# Patient Record
Sex: Female | Born: 1970
Health system: Southern US, Community
[De-identification: ages and names within clinical notes are randomized; demographics above are authoritative.]

## PROBLEM LIST (undated history)

## (undated) DIAGNOSIS — F329 Major depressive disorder, single episode, unspecified: Secondary | ICD-10-CM

## (undated) DIAGNOSIS — M549 Dorsalgia, unspecified: Secondary | ICD-10-CM

## (undated) DIAGNOSIS — F3289 Other specified depressive episodes: Secondary | ICD-10-CM

## (undated) DIAGNOSIS — M4120 Other idiopathic scoliosis, site unspecified: Secondary | ICD-10-CM

## (undated) DIAGNOSIS — J301 Allergic rhinitis due to pollen: Secondary | ICD-10-CM

## (undated) HISTORY — PX: UTERINE FIBROID EMBOLIZATION: SHX825

## (undated) HISTORY — DX: Other idiopathic scoliosis, site unspecified: M41.20

## (undated) HISTORY — DX: Major depressive disorder, single episode, unspecified: F32.9

## (undated) HISTORY — DX: Allergic rhinitis due to pollen: J30.1

## (undated) HISTORY — DX: Dorsalgia, unspecified: M54.9

## (undated) HISTORY — DX: Other specified depressive episodes: F32.89

---

## 2006-03-20 LAB — CONVERTED CEMR LAB: Pap Smear: NORMAL

## 2006-07-09 ENCOUNTER — Encounter: Payer: Self-pay | Admitting: Maternal & Fetal Medicine

## 2006-08-27 ENCOUNTER — Encounter: Payer: Self-pay | Admitting: Maternal & Fetal Medicine

## 2006-10-15 ENCOUNTER — Encounter: Payer: Self-pay | Admitting: Obstetrics and Gynecology

## 2006-11-12 ENCOUNTER — Observation Stay: Payer: Self-pay | Admitting: Obstetrics and Gynecology

## 2006-11-16 ENCOUNTER — Observation Stay: Payer: Self-pay | Admitting: Obstetrics and Gynecology

## 2006-11-23 ENCOUNTER — Observation Stay: Payer: Self-pay | Admitting: Obstetrics and Gynecology

## 2006-11-30 ENCOUNTER — Observation Stay: Payer: Self-pay | Admitting: Obstetrics and Gynecology

## 2006-12-07 ENCOUNTER — Observation Stay: Payer: Self-pay | Admitting: Obstetrics and Gynecology

## 2006-12-10 ENCOUNTER — Inpatient Hospital Stay: Payer: Self-pay | Admitting: Obstetrics and Gynecology

## 2008-02-18 ENCOUNTER — Encounter: Payer: Self-pay | Admitting: Family Medicine

## 2008-02-18 LAB — CONVERTED CEMR LAB: Pap Smear: NORMAL

## 2008-05-18 ENCOUNTER — Encounter: Payer: Self-pay | Admitting: Family Medicine

## 2008-05-18 LAB — CONVERTED CEMR LAB: Pap Smear: NORMAL

## 2008-05-18 LAB — HM PAP SMEAR

## 2008-10-26 ENCOUNTER — Ambulatory Visit: Payer: Self-pay | Admitting: Family Medicine

## 2008-10-26 DIAGNOSIS — F331 Major depressive disorder, recurrent, moderate: Secondary | ICD-10-CM | POA: Insufficient documentation

## 2008-10-26 DIAGNOSIS — G8929 Other chronic pain: Secondary | ICD-10-CM | POA: Insufficient documentation

## 2008-10-26 DIAGNOSIS — M4120 Other idiopathic scoliosis, site unspecified: Secondary | ICD-10-CM | POA: Insufficient documentation

## 2008-10-26 DIAGNOSIS — J301 Allergic rhinitis due to pollen: Secondary | ICD-10-CM | POA: Insufficient documentation

## 2008-10-26 DIAGNOSIS — M549 Dorsalgia, unspecified: Secondary | ICD-10-CM

## 2008-10-26 DIAGNOSIS — L259 Unspecified contact dermatitis, unspecified cause: Secondary | ICD-10-CM | POA: Insufficient documentation

## 2009-04-06 ENCOUNTER — Telehealth: Payer: Self-pay | Admitting: Family Medicine

## 2010-03-19 NOTE — Progress Notes (Signed)
Summary: Rx Effexor  Phone Note Refill Request Call back at Home Phone [phone] Message from:  Patient on April 06, 2009 11:29 AM  Refills Requested: Medication #1:  EFFEXOR XR 37.5 MG XR24H-CAP 1 tab by mouth daily Received Rx refill request from patient.  Please call when Rx has been called in.   Method Requested: Telephone to Pharmacy Initial call taken by: Linde Gillis CMA [facility]),  April 06, 2009 11:30 AM  Follow-up for Phone Call        Please call to find out what dose she is on..I have both down... Follow-up by: Kerby Nora MD,  April 06, 2009 11:33 AM    Prescriptions: EFFEXOR XR 75 MG XR24H-CAP (VENLAFAXINE HCL) 1 tab by mouth daily  #30 x 6   Entered by:   Benny Lennert CMA (AAMA)   Authorized by:   Kerby Nora MD   Signed by:   Benny Lennert CMA (AAMA) on 04/06/2009   Method used:   Electronically to        [facility]* (retail)       [address]       [place], [place]  [address]       Ph: [phone]       Fax: [phone]   RxID:   [id number]

## 2010-03-19 NOTE — Letter (Signed)
Summary: Records Dated 01-29-05 thru 03-14-08/[facility]  Records Dated 01-29-05 thru 03-14-08/[facility]   Imported By: Lanelle Bal 03/05/2009 09:40:20  _____________________________________________________________________  External Attachment:    Type:   Image     Comment:   External Document

## 2010-03-25 ENCOUNTER — Telehealth: Payer: Self-pay | Admitting: Family Medicine

## 2010-04-02 ENCOUNTER — Ambulatory Visit (INDEPENDENT_AMBULATORY_CARE_PROVIDER_SITE_OTHER): Payer: BC Managed Care – PPO | Admitting: Family Medicine

## 2010-04-02 ENCOUNTER — Encounter: Payer: Self-pay | Admitting: Family Medicine

## 2010-04-02 DIAGNOSIS — J45990 Exercise induced bronchospasm: Secondary | ICD-10-CM | POA: Insufficient documentation

## 2010-04-02 DIAGNOSIS — F3289 Other specified depressive episodes: Secondary | ICD-10-CM

## 2010-04-02 DIAGNOSIS — F329 Major depressive disorder, single episode, unspecified: Secondary | ICD-10-CM

## 2010-04-04 NOTE — Progress Notes (Signed)
Summary: Venlafaxine  Phone Note Refill Request Message from:  Fax from Pharmacy on March 25, 2010 12:23 PM  Refills Requested: Medication #1:  EFFEXOR XR 75 MG XR24H-CAP 1 tab by mouth daily. Patient has not been seen in > 1 year.  [facility].   Method Requested: Electronic Initial call taken by: Delilah Shan CMA [facility]),  March 25, 2010 12:24 PM  Follow-up for Phone Call        refill once.. need to make appt for CPX.  Follow-up by: Kerby Nora MD,  March 26, 2010 8:12 AM  Additional Follow-up for Phone Call Additional follow up Details #1::        refilled medication and left message for patient to call and schedule physical appt before she runs out of medication Additional Follow-up by: Benny Lennert CMA (AAMA),  March 26, 2010 8:15 AM    New/Updated Medications: EFFEXOR XR 75 MG XR24H-CAP (VENLAFAXINE HCL) 1 tab by mouth daily Prescriptions: EFFEXOR XR 75 MG XR24H-CAP (VENLAFAXINE HCL) 1 tab by mouth daily  #30 x 0   Entered by:   Benny Lennert CMA (AAMA)   Authorized by:   Kerby Nora MD   Signed by:   Benny Lennert CMA (AAMA) on 03/26/2010   Method used:   Electronically to        [facility] (retail)       [address]       [place], [place]  [address]       Ph: [phone]       Fax: [phone]   RxID:   [id number]

## 2010-04-08 ENCOUNTER — Encounter (INDEPENDENT_AMBULATORY_CARE_PROVIDER_SITE_OTHER): Payer: Self-pay | Admitting: *Deleted

## 2010-04-08 ENCOUNTER — Ambulatory Visit (INDEPENDENT_AMBULATORY_CARE_PROVIDER_SITE_OTHER): Payer: BC Managed Care – PPO | Admitting: Family Medicine

## 2010-04-08 ENCOUNTER — Encounter: Payer: Self-pay | Admitting: Family Medicine

## 2010-04-08 DIAGNOSIS — T6191XA Toxic effect of unspecified seafood, accidental (unintentional), initial encounter: Secondary | ICD-10-CM

## 2010-04-08 DIAGNOSIS — T61781A Other shellfish poisoning, accidental (unintentional), initial encounter: Secondary | ICD-10-CM | POA: Insufficient documentation

## 2010-04-10 NOTE — Assessment & Plan Note (Signed)
Summary: REFILL MEDICATION/CLE   [employer]   Vital Signs:  Patient profile:   40 year old female Height:      63 inches Weight:      171.0 pounds BMI:     30.40 Temp:     98.6 degrees F oral Pulse rate:   80 / minute Pulse rhythm:   regular BP sitting:   120 / 80  (left arm) Cuff size:   regular  Vitals Entered By: Benny Lennert CMA [facility]) (April 02, 2010 3:49 PM)  History of Present Illness: Chief complaint refill medication  Last seen in 10/2008..  sees GYN.. CPX 2011  Depression,  well control on effxor XR. NO SI. Not interested in trying to come off the med a tthis time.   SOB associated with allergies.. had exercsie induced asthma as a child, had inhaled. Also triggered with fish, pollen, exercsie etc.  Occuring every few months.  No nighttime cough.   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 1 Tab By Mouth Daily  Allergies (verified): No Known Drug Allergies  Past History:  Past medical, surgical, family and social histories (including risk factors) reviewed, and no changes noted (except as noted below).  Past Medical History: Reviewed history from 10/26/2008 and no changes required. Current Problems:  BACK PAIN  (ICD-724.5) SCOLIOSIS (ICD-737.30) ALLERGIC RHINITIS DUE TO POLLEN (ICD-477.0) DEPRESSION (ICD-311)    Past Surgical History: Reviewed history from 10/26/2008 and no changes required. none  Family History: Reviewed history from 10/26/2008 and no changes required. Family History of Alcoholism/Addiction Family History of Arthritis Family History Depression Family History Diabetes 1st degree relative, brother Family History High cholesterol, brother, father Family History Hypertension, brother, father Family History Liver cancer, father Family History of Neurological disorder(alzhiemers mother) mother with osteoporosis  Social History: Reviewed history from 10/26/2008 and no changes required. 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 Diet: fruit and veggies, loves ice cream, drinks water 32 oz a day, limited calcium intake.   Review of Systems General:  Denies fatigue and fever. CV:  Denies chest pain or discomfort. Resp:  Complains of shortness of breath; SOB associated with allergies.. had exercsie induced asthma as a child, had inhaled. Also triggered with fish, pollen, exercsie etc.  Occuring every few months.  No nighttime cough. Marland Kitchen GI:  Denies abdominal pain, bloody stools, constipation, and diarrhea. GU:  Denies dysuria.  Physical Exam  General:  Well-developed,well-nourished,in no acute distress; alert,appropriate and cooperative throughout examination Ears:  External ear exam shows no significant lesions or deformities.  Otoscopic examination reveals clear canals, tympanic membranes are intact bilaterally without  bulging, retraction, inflammation or discharge. Hearing is grossly normal bilaterally. Nose:  External nasal examination shows no deformity or inflammation. Nasal mucosa are pink and moist without lesions or  exudates. Mouth:  Oral mucosa and oropharynx without lesions or exudates.  Teeth in good repair. Neck:  no carotid bruit or thyromegaly no cervical or supraclavicular lymphadenopathy  Lungs:  Normal respiratory effort, chest expands symmetrically. Lungs are clear to auscultation, no crackles or wheezes. Heart:  Normal rate and regular rhythm. S1 and S2 normal without gallop, murmur, click, rub or other extra sounds. Abdomen:  Bowel sounds positive,abdomen soft and non-tender without masses, organomegaly or hernias noted. Pulses:  R and L posterior tibial pulses are full and equal bilaterally  Extremities:  no edema  Psych:  Cognition and judgment appear intact. Alert and cooperative with normal attention span and concentration. No apparent delusions, illusions, hallucinations   Impression & Recommendations:  Problem # 1:  DEPRESSION (ICD-311) Well controlled. Continue current medication.  The following medications were removed from the medication list:    Effexor Xr 37.5 Mg Xr24h-cap (Venlafaxine hcl) .Marland Kitchen... 1 tab by mouth daily Her updated medication list for this problem includes:    Effexor Xr 75 Mg Xr24h-cap (Venlafaxine hcl) .Marland Kitchen... 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 if occuring more often will need PFTs and likely daily singulair. Her updated medication list for this problem includes:    Proair Hfa 108 (90 Base) Mcg/act Aers (Albuterol sulfate) .Marland Kitchen... 2 puff inhaled every 4-6 hours as needed for wheeze.  Problem # 3:  Preventive Health Care (ICD-V70.0) Assessment: Comment Only Due for CPX and fasting labs.. schedule  Complete Medication List: 1)  Mirena 20 Mcg/24hr Iud (Levonorgestrel) 2)  Effexor Xr 75 Mg Xr24h-cap (Venlafaxine hcl) .Marland Kitchen.. 1 tab by mouth daily 3)  Proair Hfa 108 (90 Base) Mcg/act Aers (Albuterol sulfate) .... 2 puff inhaled every 4-6 hours as needed for wheeze.  Patient Instructions: 1)  If wheezing  or inhaler use occuring ever week.. call to make an appt to get better control.  2)   Use claritin for allergiy season.  3)   Fasting lipids, CMEt Dx v77.91. prior to CPX.  4)  Schedule CPX  with pap after April 2012. Prescriptions: EFFEXOR XR 75 MG XR24H-CAP (VENLAFAXINE HCL) 1 tab by mouth daily  #90 x 3   Entered and Authorized by:   Kerby Nora MD   Signed by:   Kerby Nora MD on 04/02/2010   Method used:   Electronically to        [facility]* (retail)       [address]       [place], [place]  [address]       Ph: [phone]       Fax: [phone]   RxID:   [id number] PROAIR HFA 108 (90 BASE) MCG/ACT AERS (ALBUTEROL SULFATE) 2 puff inhaled every 4-6 hours as needed for wheeze.  #1 x 2   Entered and Authorized by:   Kerby Nora MD   Signed by:   Kerby Nora MD on 04/02/2010   Method used:   Electronically to        [facility]* (retail)       [address]       [place], [place]  [address]       Ph: [phone]       Fax: [phone]   RxID:   [id number]    Orders  Added: 1)  Est. Patient Level IV [[address]]    Current Allergies (reviewed today): No known allergies   Flu Vaccine Next Due:  Refused Last PAP:  normal (03/20/2006 10:35:25 AM) PAP Result Date:  05/18/2008 PAP Result:  normal PAP Next Due:  1 yr

## 2010-04-16 NOTE — Assessment & Plan Note (Signed)
Summary: ALLERGIC REACTION TO FISH/CLE  [employer]   Vital Signs:  Patient profile:   40 year old female Height:      63 inches Weight:      171.50 pounds BMI:     30.49 Temp:     99.3 degrees F oral Pulse rate:   80 / minute Pulse rhythm:   regular BP sitting:   110 / 70  (left arm)  Vitals Entered By: Benny Lennert CMA [facility]) (April 08, 2010 9:28 AM)  History of Present Illness: 3Chief complaint allergic reaction to fish  39 year old female:  thank you to evaluate the patient when told that we have a patient with fish allergy and facial swelling. Has a known fish allergy, started around 3 o'clock yesterday.  Taken Benadryl, 2 pills once yesterday. no other medications have been taken.  The patient does have a prior history of facial swelling from fish allergy, but this is been many years. She also doesn't a history of bronchospasm due to fish allergy, but is not having any currently.  She did not eat any fish, she did not cook fish, but she did handle a slightly when her husband was going to cook some fish.  right-sided upper face and eyelid swelling right greater than left. Left eye is slightly swollen, but she is able to fully open her left eye.  Allergies (verified): No Known Drug Allergies  Past History:  Past medical, surgical, family and social histories (including risk factors) reviewed, and no changes noted (except as noted below).  Past Medical History: Reviewed history from 10/26/2008 and no changes required. Current Problems:  BACK PAIN (ICD-724.5) SCOLIOSIS (ICD-737.30) ALLERGIC RHINITIS DUE TO POLLEN (ICD-477.0) DEPRESSION (ICD-311)    Past Surgical History: Reviewed history from 10/26/2008 and no changes required. none  Family History: Reviewed history from 10/26/2008 and no changes required. Family History of Alcoholism/Addiction Family History of Arthritis Family History Depression Family History Diabetes 1st degree relative, brother Family  History High cholesterol, brother, father Family History Hypertension, brother, father Family History Liver cancer, father Family History of Neurological disorder(alzhiemers mother) mother with osteoporosis  Social History: Reviewed history from 10/26/2008 and no changes required. 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 Diet: fruit and veggies, loves ice cream, drinks water 32 oz a day, limited calcium intake.   Review of Systems      See HPI General:  Denies chills and fever. Eyes:  able to see, but swelling obstructs R eye.Marland Kitchen  Physical Exam  General:  alert, well-developed, well-nourished, and well-hydrated.   Head:  normocephalic and atraumatic.   Eyes:  vision grossly intact, pupils equal, pupils round, pupils reactive to light, and pupils react to accomodation.    no right-sided eyelid and. Orbital swelling, right greater than left. To some degree on the left. When eyes open, vision and ocular motion is intact. Field of vision is intact. Ears:  no external deformities.   Lungs:  normal respiratory effort.   Skin:  no new rashes facial eczema Psych:  Cognition and judgment appear intact. Alert and cooperative with normal attention span and concentration. 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  Benadryl and cimetidine as below over the next several days. Prednisone  for one week.  Advised about avoidance of fish  Complete Medication List: 1)  Mirena 20 Mcg/24hr Iud (Levonorgestrel) 2)  Effexor Xr 75 Mg Xr24h-cap (Venlafaxine hcl) .Marland Kitchen.. 1 tab by mouth daily 3)  Proair Hfa 108 (90 Base) Mcg/act Aers (Albuterol sulfate) .... 2 puff inhaled every 4-6  hours as needed for wheeze. 4)  Epipen 2-pak 0.3 Mg/0.45ml Devi (Epinephrine) .Marland Kitchen.. 1 [place] injection if severe allergy 5)  Prednisone 20 Mg Tabs (Prednisone) .... 2 tabs by mouth x 4 days, then 1 tab by mouth x 3 days 6)  Cimetidine 400 Mg Tabs (Cimetidine) .Marland Kitchen.. 1 by mouth 4 times daily for 7 days  Patient Instructions: 1)  Benadryl 2 tabs, every 6 hours for the next 2 days 2)  Cimetidine 1 tab every 6 hours for the next 2 days Prescriptions: CIMETIDINE 400 MG TABS (CIMETIDINE) 1 by mouth 4 times daily for 7 days  #28 x 0   Entered and Authorized by:   Hannah Beat MD   Signed by:   Hannah Beat MD on 04/08/2010   Method used:   Electronically to        [facility]* (retail)       [address]       [place], [place]  [address]       Ph: [phone]       Fax: [phone]   RxID:   [id number] PREDNISONE 20 MG TABS (PREDNISONE) 2 tabs by mouth x 4 days, then 1 tab by mouth x 3 days  #11 x 0   Entered and Authorized by:   Hannah Beat MD   Signed by:   Hannah Beat MD on 04/08/2010   Method used:   Electronically to        [facility]* (retail)       [address]       [place], [place]  [address]       Ph: [phone]       Fax: [phone]   RxID:   [id number] EPIPEN 2-PAK 0.3 MG/0.3ML DEVI (EPINEPHRINE) 1 [place] injection if severe allergy  #1 pack x 0   Entered and Authorized by:   Hannah Beat MD   Signed by:   Hannah Beat MD on 04/08/2010   Method used:   Electronically to        [facility]* (retail)       [address]       [place], [place]  [address]       Ph: [phone]       Fax: [phone]   RxID:   [id number]    Orders Added: 1)  Est. Patient Level IV [[address]]    Current Allergies (reviewed today): No known allergies

## 2010-04-16 NOTE — Letter (Signed)
Summary: Out of Work  [employer] [employer] [facility]  [address] [place], [place] [address]   Phone: [phone]  Fax: [phone]    April 08, 2010   Employee:  LEELOO SILVERTHORNE    To Whom It May Concern:   For Medical reasons, please excuse the above named employee from work for the following dates:  Start:  April 08, 2010 9:41 AM   End:   May Return to Work on April 10 2010  If you need additional information, please feel free to contact our office.         Sincerely,   Hannah Beat MD

## 2010-09-19 LAB — BASIC METABOLIC PANEL
BUN: 5 mg/dL (ref 4–21)
Creatinine: 0.8 mg/dL (ref 0.5–1.1)
Glucose: 77 mg/dL

## 2010-09-19 LAB — HEPATIC FUNCTION PANEL
ALT: 26 U/L (ref 7–35)
AST: 21 U/L (ref 13–35)
Alkaline Phosphatase: 60 U/L (ref 25–125)
Bilirubin, Total: 0.2 mg/dL

## 2010-09-26 ENCOUNTER — Encounter: Payer: BC Managed Care – PPO | Admitting: Family Medicine

## 2010-10-17 ENCOUNTER — Telehealth: Payer: Self-pay | Admitting: Family Medicine

## 2010-10-17 DIAGNOSIS — Z13220 Encounter for screening for lipoid disorders: Secondary | ICD-10-CM

## 2010-10-17 NOTE — Telephone Encounter (Signed)
Message copied by Excell Seltzer on Thu Oct 17, 2010  1:27 PM ------      Message from: Baldomero Lamy      Created: Wed Oct 09, 2010  2:00 PM      Regarding: Cpx labs Fri 8/31       Please order  future cpx labs for pt's upcomming lab appt.      Thanks      Rodney Booze

## 2010-10-18 ENCOUNTER — Other Ambulatory Visit: Payer: BC Managed Care – PPO

## 2010-10-22 ENCOUNTER — Encounter: Payer: Self-pay | Admitting: Family Medicine

## 2010-10-23 ENCOUNTER — Encounter: Payer: Self-pay | Admitting: Family Medicine

## 2010-10-23 ENCOUNTER — Ambulatory Visit (INDEPENDENT_AMBULATORY_CARE_PROVIDER_SITE_OTHER)
Admission: RE | Admit: 2010-10-23 | Discharge: 2010-10-23 | Disposition: A | Discharge status: Home / Self Care | Payer: BC Managed Care – PPO | Source: Ambulatory Visit | Attending: Family Medicine | Admitting: Family Medicine

## 2010-10-23 ENCOUNTER — Ambulatory Visit (INDEPENDENT_AMBULATORY_CARE_PROVIDER_SITE_OTHER): Payer: BC Managed Care – PPO | Admitting: Family Medicine

## 2010-10-23 VITALS — BP 90/60 | HR 60 | Temp 98.4°F | Ht 63.75 in | Wt 171.8 lb

## 2010-10-23 DIAGNOSIS — M79645 Pain in left finger(s): Secondary | ICD-10-CM

## 2010-10-23 DIAGNOSIS — G8929 Other chronic pain: Secondary | ICD-10-CM

## 2010-10-23 DIAGNOSIS — F3289 Other specified depressive episodes: Secondary | ICD-10-CM

## 2010-10-23 DIAGNOSIS — F329 Major depressive disorder, single episode, unspecified: Secondary | ICD-10-CM

## 2010-10-23 DIAGNOSIS — M79609 Pain in unspecified limb: Secondary | ICD-10-CM

## 2010-10-23 DIAGNOSIS — M549 Dorsalgia, unspecified: Secondary | ICD-10-CM

## 2010-10-23 MED ORDER — MELOXICAM 7.5 MG PO TABS: ORAL_TABLET | ORAL | Status: DC

## 2010-10-23 NOTE — Assessment & Plan Note (Signed)
Concerning for fracture at base of proximal phalanx of 5th digit. eval with X-rays.

## 2010-10-23 NOTE — Assessment & Plan Note (Addendum)
Well controlled on effexor 75 mg daily.

## 2010-10-23 NOTE — Progress Notes (Signed)
Subjective:    Patient ID: Morgan Kemp, female    DOB: 09/06/1970, 40 y.o.   MRN: [id number]  HPI  The patient is here for  Yearly reeval. Sees GYN for  CPX.   Plays softball.. Ran into fence.. Hit 5th finger in glove on left hand. Not sure how hand twisted.  Since then there has been pain, difficulty bending, no swelling, no redness. Pain  Is located in proximal phalange.  Low back pain, worse in last year.. She has history of scoliosis, no past surgery. Pain is daily. Nothing makes it worse, stretching helps some. Using tylenol PM. No radiating pain to legs, no weakness. No fever. Has not had X-ray in 30 years. She has never tried prescription med for low back pain. No recent injury.  Had labs done with insurance.. Nml UA, glucose, CMET. Lipids (LDL 110)  Review of Systems  Constitutional: Negative for fever, fatigue and unexpected weight change.  HENT: Negative for ear pain, congestion, sore throat, sneezing, trouble swallowing and sinus pressure.   Eyes: Negative for pain and itching.  Respiratory: Negative for cough, shortness of breath and wheezing.   Cardiovascular: Negative for chest pain, palpitations and leg swelling.  Gastrointestinal: Negative for nausea, abdominal pain, diarrhea, constipation and blood in stool.  Genitourinary: Negative for dysuria, hematuria, vaginal discharge, difficulty urinating and menstrual problem.  Skin: Negative for rash.  Neurological: Negative for syncope, weakness, light-headedness, numbness and headaches.  Psychiatric/Behavioral: Negative for confusion and dysphoric mood. The patient is not nervous/anxious.        Objective:   Physical Exam  Constitutional: Vital signs are normal. She appears well-developed and well-nourished. She is cooperative.  Non-toxic appearance. She does not appear ill. No distress.  HENT:  Head: Normocephalic.  Right Ear: Hearing, tympanic membrane, external ear and ear canal normal. Tympanic membrane is  not erythematous, not retracted and not bulging.  Left Ear: Hearing, tympanic membrane, external ear and ear canal normal. Tympanic membrane is not erythematous, not retracted and not bulging.  Nose: No mucosal edema or rhinorrhea. Right sinus exhibits no maxillary sinus tenderness and no frontal sinus tenderness. Left sinus exhibits no maxillary sinus tenderness and no frontal sinus tenderness.  Mouth/Throat: Uvula is midline, oropharynx is clear and moist and mucous membranes are normal.  Eyes: Conjunctivae, EOM and lids are normal. Pupils are equal, round, and reactive to light. No foreign bodies found.  Neck: Trachea normal and normal range of motion. Neck supple. Carotid bruit is not present. No mass and no thyromegaly present.  Cardiovascular: Normal rate, regular rhythm, S1 normal, S2 normal, normal heart sounds, intact distal pulses and normal pulses.  Exam reveals no gallop and no friction rub.   No murmur heard. Pulmonary/Chest: Effort normal and breath sounds normal. Not tachypneic. No respiratory distress. She has no decreased breath sounds. She has no wheezes. She has no rhonchi. She has no rales.  Abdominal: Soft. Normal appearance and bowel sounds are normal. There is no tenderness.  Musculoskeletal:       Lumbar back: She exhibits decreased range of motion. She exhibits no tenderness and no swelling.       Left hand: She exhibits tenderness and bony tenderness. She exhibits no deformity.       No current ttp, neg SLR, neg Faber's   TTP in proximal phalanx, pain with flexion and extension, but intact, no swelling , no contusion No hand pain.  Neurological: She is alert.  Skin: Skin is warm, dry and intact. No rash  noted.  Psychiatric: Her speech is normal and behavior is normal. Judgment and thought content normal. Her mood appears not anxious. Cognition and memory are normal. She does not exhibit a depressed mood.          Assessment & Plan:  CPX: The patient's preventative  maintenance and recommended screening tests for an annual wellness exam were reviewed in full today. Brought up to date unless services declined.  Counselled on the importance of diet, exercise, and its role in overall health and mortality. The patient's FH and SH was reviewed, including their home life, tobacco status, and drug and alcohol status.   PAP/DVE/breast exam  Per GYN  UptoDate with vaccines

## 2010-10-23 NOTE — Patient Instructions (Addendum)
Use meloxicam for low back pain,  Heat, low back stretching. We will call with X-ray results and recommendations for back and finger.

## 2010-10-23 NOTE — Assessment & Plan Note (Signed)
No current pain. 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 Will eval with X-ray given worsening.  Treta with heat, NSAIDs, stretches. Consider PT if not improving.

## 2010-10-29 ENCOUNTER — Encounter: Payer: Self-pay | Admitting: Family Medicine

## 2010-11-04 ENCOUNTER — Encounter: Payer: Self-pay | Admitting: Family Medicine

## 2011-04-21 ENCOUNTER — Ambulatory Visit (INDEPENDENT_AMBULATORY_CARE_PROVIDER_SITE_OTHER): Payer: BC Managed Care – PPO | Admitting: Family Medicine

## 2011-04-21 ENCOUNTER — Encounter: Payer: Self-pay | Admitting: Family Medicine

## 2011-04-21 VITALS — BP 120/72 | HR 81 | Temp 98.4°F | Ht 63.5 in | Wt 171.4 lb

## 2011-04-21 DIAGNOSIS — J0100 Acute maxillary sinusitis, unspecified: Secondary | ICD-10-CM

## 2011-04-21 MED ORDER — AMOXICILLIN 500 MG PO CAPS: 1000.0000 mg | ORAL_CAPSULE | Freq: Two times a day (BID) | ORAL | Status: AC

## 2011-04-21 MED ORDER — FLUCONAZOLE 150 MG PO TABS: 150.0000 mg | ORAL_TABLET | Freq: Once | ORAL | Status: AC

## 2011-04-21 NOTE — Progress Notes (Signed)
  Patient Name: Morgan Kemp Date of Birth: 10/20/1970 Medical Record Number: [id number] Gender: female Date of Encounter: 04/21/2011  History of Present Illness:  Morgan Kemp is a 41 y.o. very pleasant female patient who presents with the following:  3 weeks has been sick. Lost voice about a month ago, and raspy coughing. Now with bad facial pressure and pain.   Sinus Pain: Patient complains of bilateral ear pressure/pain, achiness, facial pain, headache described as frontal and max, nasal congestion and purulent nasal discharge. Symptoms include as above with no fever, chills, night sweats or weight loss. Onset of symptoms was 3 weeks ago, gradually worsening since that time. She is drinking plenty of fluids.  Past history is significant for multiple sinus infections. Patient is non-smoker   Patient Active Problem List  Diagnoses  . DEPRESSION  . ALLERGIC RHINITIS DUE TO POLLEN  . ECZEMA  . Chronic back pain  . SCOLIOSIS  . ASTHMA, EXERCISE INDUCED  . TOXIC EFFECT OF FISH AND SHELLFISH  . Finger pain, left   Past Medical History  Diagnosis Date  . Backache, unspecified   . Scoliosis (and kyphoscoliosis), idiopathic   . Allergic rhinitis due to pollen   . Depressive disorder, not elsewhere classified    No past surgical history on file. History  Substance Use Topics  . Smoking status: Never Smoker   . Smokeless tobacco: Not on file  . Alcohol Use: Yes   Family History  Problem Relation Age of Onset  . Alzheimer's disease Mother   . Osteoporosis Mother   . Hyperlipidemia Father   . Hypertension Father   . Cancer Father     liver  . Diabetes Brother   . Hyperlipidemia Brother   . Hypertension Brother    No Known Allergies  Medication list has been reviewed and updated.  Review of Systems: ROS: GEN: Acute illness details above GI: Tolerating PO intake GU: maintaining adequate hydration and urination Pulm: No SOB Interactive and getting along well at  home.  Otherwise, ROS is as per the HPI.   Physical Examination: Filed Vitals:   04/21/11 1211  BP: 120/72  Pulse: 81  Temp: 98.4 F (36.9 C)  TempSrc: Oral  Height: 5' 3.5" (1.613 m)  Weight: 171 lb 6.4 oz (77.747 kg)  SpO2: 100%    Body mass index is 29.89 kg/(m^2).   Gen: WDWN, NAD; alert,appropriate and cooperative throughout exam  HEENT: Normocephalic and atraumatic. Throat clear, w/o exudate, no LAD, R TM clear, L TM - good landmarks, No fluid present. rhinnorhea.  Left frontal and maxillary sinuses: Tender Right frontal and maxillary sinuses: Tender  Neck: No ant or post LAD CV: RRR, No M/G/R Pulm: Breathing comfortably in no resp distress. no w/c/r Abd: S,NT,ND,+BS Extr: no c/c/e Psych: full affect, pleasant   Assessment and Plan: 1. Sinusitis, acute maxillary  amoxicillin (AMOXIL) 500 MG capsule, fluconazole (DIFLUCAN) 150 MG tablet    Acute sinusitis: ABX as below.  Refer to the patient instructions sections for details of plan shared with patient.  Reviewed symptomatic care as well as ABX in this case.

## 2011-07-17 ENCOUNTER — Other Ambulatory Visit: Payer: Self-pay

## 2011-07-17 MED ORDER — VENLAFAXINE HCL ER 75 MG PO CP24: 75.0000 mg | ORAL_CAPSULE | Freq: Every day | ORAL | Status: DC

## 2011-07-17 NOTE — Telephone Encounter (Signed)
Pt request refill effexor xr .#30 x 2 sent to [facility] Pt notified while on phone and pt will call back to schedule appt in 10/2011.

## 2011-09-01 ENCOUNTER — Ambulatory Visit: Payer: Self-pay | Admitting: Family Medicine

## 2011-11-09 IMAGING — CR DG LUMBAR SPINE COMPLETE 4+V
5 series · 5 of 5 positions shown · non-contrast
Comparison: None.

CLINICAL DATA: Chronic low back pain.

LUMBAR SPINE - COMPLETE 4+ VIEW

[view not recorded (1 of 5)]
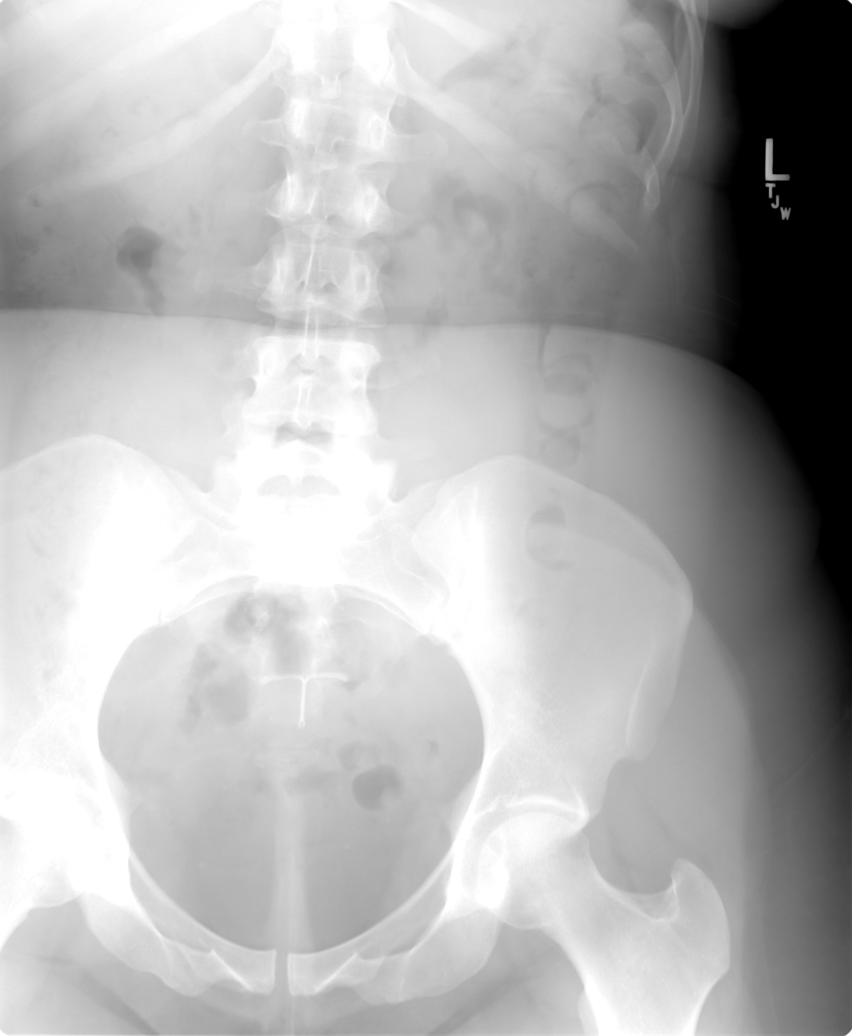

[view not recorded (2 of 5)]
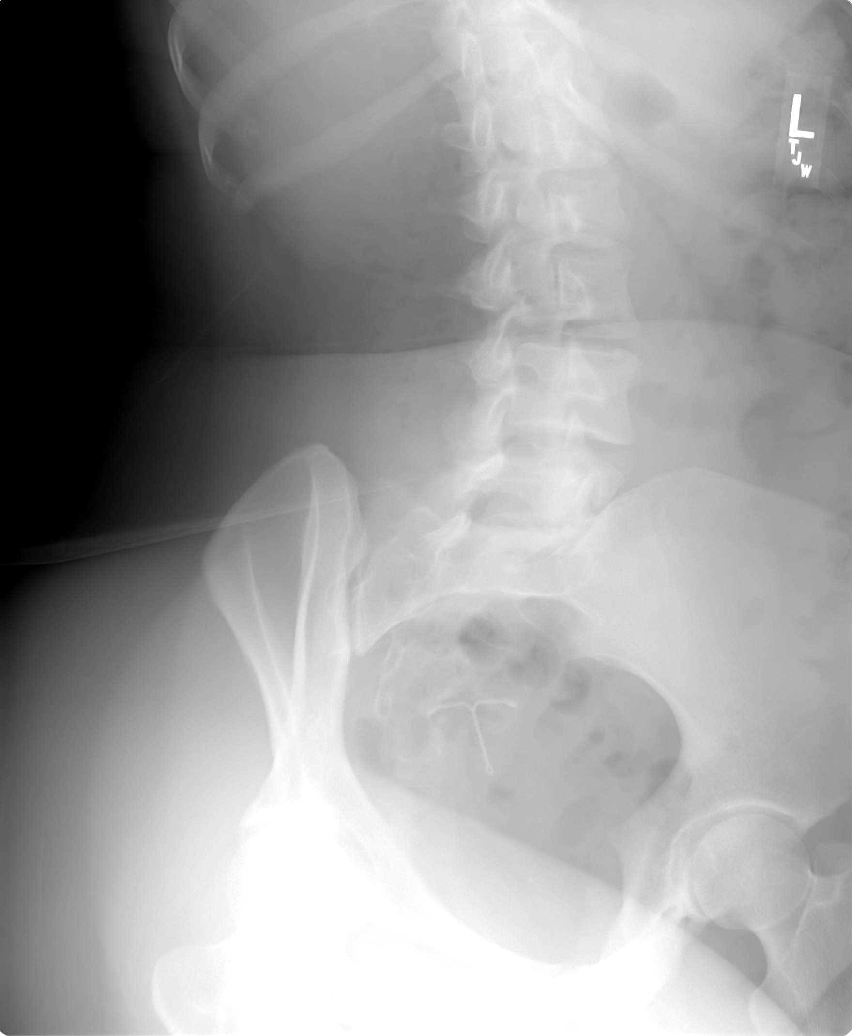

[view not recorded (3 of 5)]
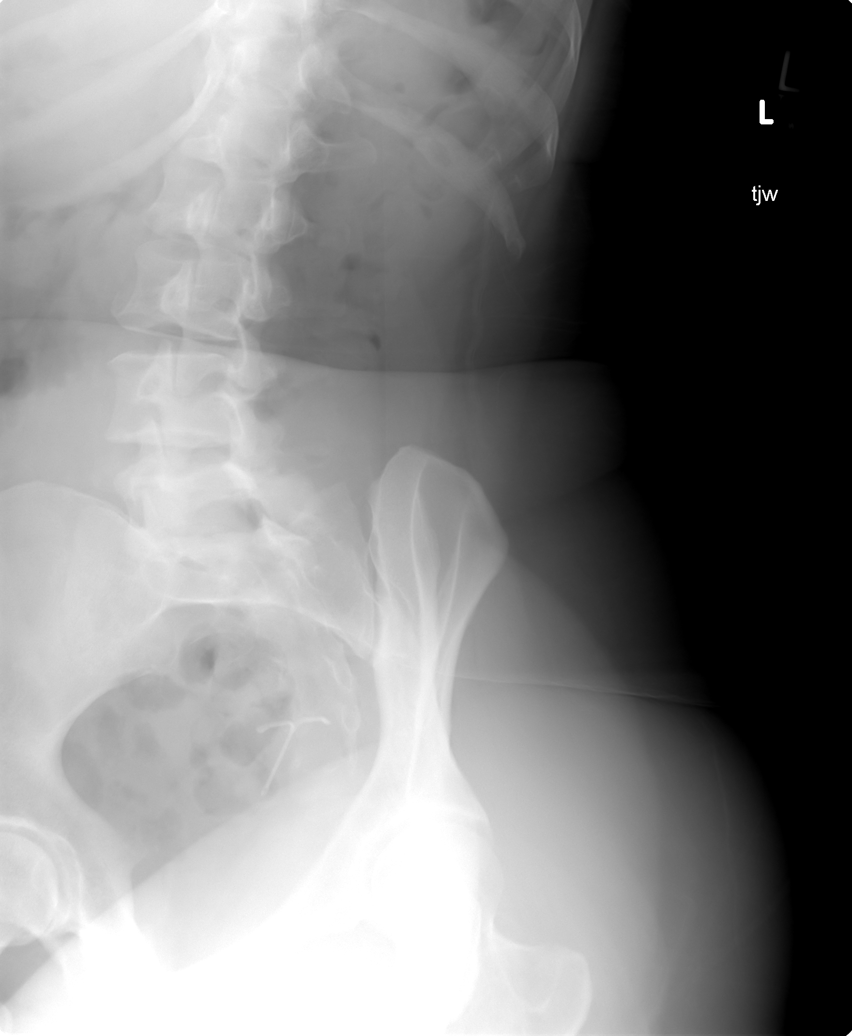

[view not recorded (4 of 5)]
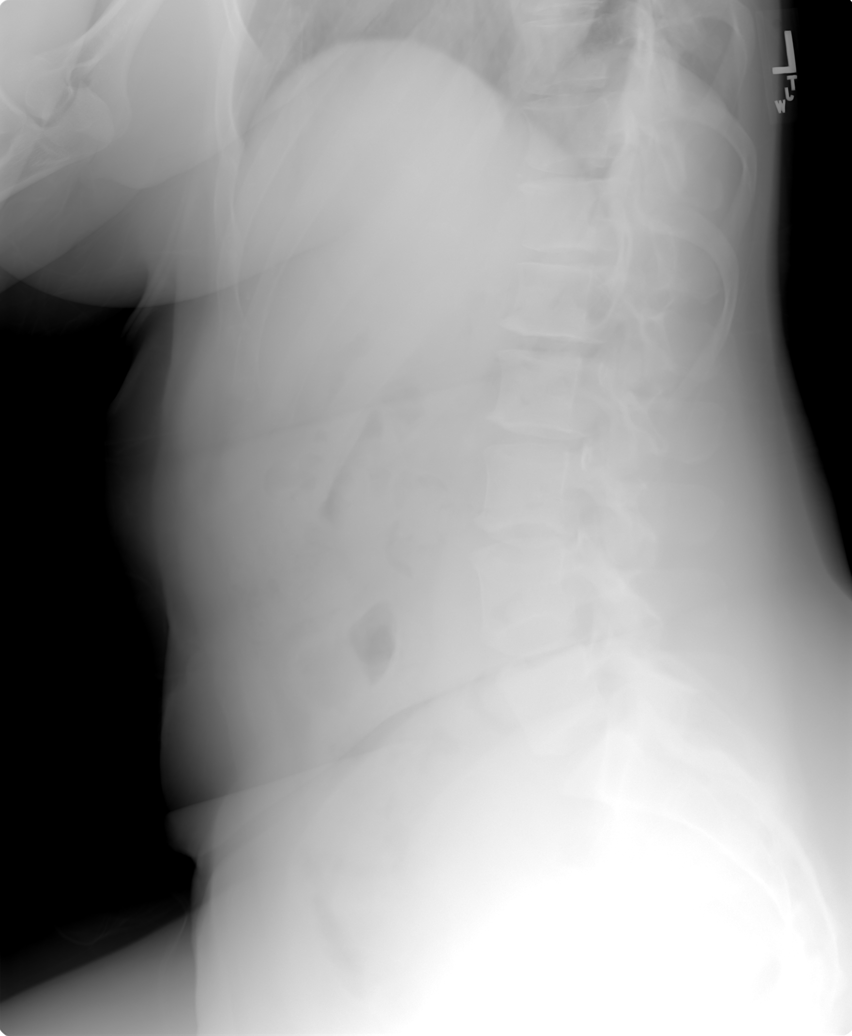

[view not recorded (5 of 5)]
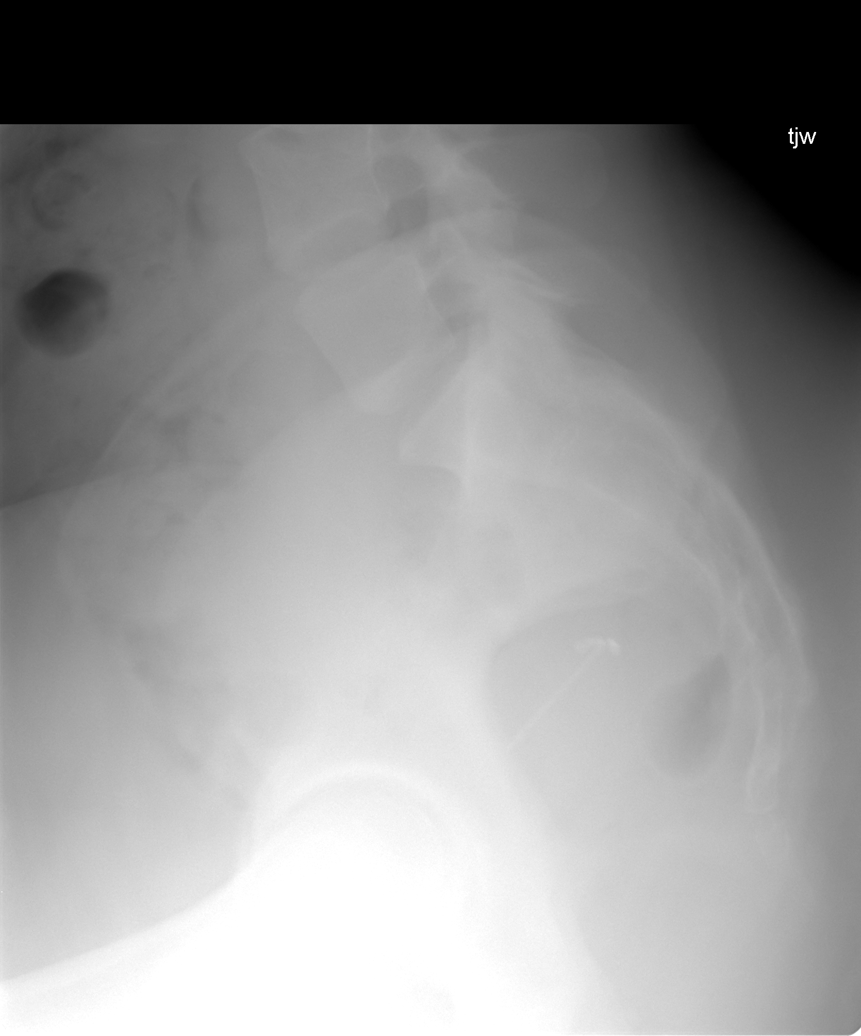

[5 of 5 positions shown; findings below may reference images not displayed]

FINDINGS: Vertebral body height and alignment are normal.
Intervertebral disc space height is maintained.  No pars
interarticularis defect is identified.  IUD noted.
IMPRESSION: Normal appearing lumbar spine.

## 2011-12-05 ENCOUNTER — Encounter: Payer: Self-pay | Admitting: Family Medicine

## 2011-12-05 ENCOUNTER — Ambulatory Visit (INDEPENDENT_AMBULATORY_CARE_PROVIDER_SITE_OTHER): Payer: BC Managed Care – PPO | Admitting: Family Medicine

## 2011-12-05 VITALS — BP 114/70 | HR 72 | Temp 98.2°F | Resp 20 | Ht 63.5 in | Wt 174.5 lb

## 2011-12-05 DIAGNOSIS — Z23 Encounter for immunization: Secondary | ICD-10-CM

## 2011-12-05 DIAGNOSIS — F329 Major depressive disorder, single episode, unspecified: Secondary | ICD-10-CM

## 2011-12-05 DIAGNOSIS — F3289 Other specified depressive episodes: Secondary | ICD-10-CM

## 2011-12-05 MED ORDER — VENLAFAXINE HCL ER 75 MG PO CP24: 75.0000 mg | ORAL_CAPSULE | Freq: Every day | ORAL | Status: DC

## 2011-12-05 NOTE — Patient Instructions (Addendum)
Work on healthy eating exercise and weight loss.  try low carb diet for better concentration.  Can try B12 supplement 1000 mcg daily for focus and energy.

## 2011-12-05 NOTE — Progress Notes (Signed)
  Subjective:    Patient ID: Morgan Kemp, female    DOB: 05-22-1970, 41 y.o.   MRN: [id number]  HPI 41 year old female presents for follow up mood. Depression: stable control on effexor.  Chronic insomnia issues. Has noted concentration issue in last year, worse in last 3 months.  Has also had night sweats. Has had some increase stress lately.  She has IUD and has regular menses.  Gyn is looking into early menopause. Had low vit D, thyroid was nml.prediabetes, no anemia. Nml cholesterol panel. Hormone levels were normal.   Has high carb nhealthy diet.   Review of Systems  Constitutional: Negative for fever and fatigue.  HENT: Negative for ear pain.   Eyes: Negative for pain.  Respiratory: Negative for chest tightness and shortness of breath.   Cardiovascular: Negative for chest pain, palpitations and leg swelling.  Gastrointestinal: Negative for abdominal pain.  Genitourinary: Negative for dysuria.       Objective:   Physical Exam  Constitutional: Vital signs are normal. She appears well-developed and well-nourished. She is cooperative.  Non-toxic appearance. She does not appear ill. No distress.  HENT:  Head: Normocephalic.  Right Ear: Hearing, tympanic membrane, external ear and ear canal normal. Tympanic membrane is not erythematous, not retracted and not bulging.  Left Ear: Hearing, tympanic membrane, external ear and ear canal normal. Tympanic membrane is not erythematous, not retracted and not bulging.  Nose: No mucosal edema or rhinorrhea. Right sinus exhibits no maxillary sinus tenderness and no frontal sinus tenderness. Left sinus exhibits no maxillary sinus tenderness and no frontal sinus tenderness.  Mouth/Throat: Uvula is midline, oropharynx is clear and moist and mucous membranes are normal.  Eyes: Conjunctivae normal, EOM and lids are normal. Pupils are equal, round, and reactive to light. No foreign bodies found.  Neck: Trachea normal and normal range of  motion. Neck supple. Carotid bruit is not present. No mass and no thyromegaly present.  Cardiovascular: Normal rate, regular rhythm, S1 normal, S2 normal, normal heart sounds, intact distal pulses and normal pulses.  Exam reveals no gallop and no friction rub.   No murmur heard. Pulmonary/Chest: Effort normal and breath sounds normal. Not tachypneic. No respiratory distress. She has no decreased breath sounds. She has no wheezes. She has no rhonchi. She has no rales.  Abdominal: Soft. Normal appearance and bowel sounds are normal. There is no tenderness.  Neurological: She is alert.  Skin: Skin is warm, dry and intact. No rash noted.  Psychiatric: Her speech is normal and behavior is normal. Judgment and thought content normal. Her mood appears not anxious. Cognition and memory are normal. She does not exhibit a depressed mood.          Assessment & Plan:

## 2011-12-05 NOTE — Assessment & Plan Note (Signed)
Well controleld on current medication. Refilled.

## 2012-02-13 ENCOUNTER — Telehealth: Payer: Self-pay | Admitting: Family Medicine

## 2012-02-13 NOTE — Telephone Encounter (Signed)
Patient Information:  Caller Name: Ersilia  Phone: [phone]  Patient: Morgan Kemp, Morgan Kemp  Gender: Female  DOB: [birth year]  Age: 41 Years  PCP: Kerby Nora (Family Practice)  Pregnant: No  Office Follow Up:  Does the office need to follow up with this patient?: No  Instructions For The Office: N/A  RN Note:  Caller declines to go to ED now. States she is home with just her children and does not have anyone to take care of them or to drive her.  States she will consider going to ED for worsening symptoms.  Symptoms  Reason For Call & Symptoms: This am caller ran into door with head , nose and upper lip. Nausea occurred initally and remains. Nosebleed occurred . Bleeding controlled after 15 mins. small cut approx. 1/2 half inch to uder side of lower lip . 100mg  of Tylenol  taken approx. 15:45pm - prior to taking med HA rated as a 10, HA now rated as a 6.  Reviewed Health History In EMR: Yes  Reviewed Medications In EMR: Yes  Reviewed Allergies In EMR: Yes  Reviewed Surgeries / Procedures: Yes  Date of Onset of Symptoms: 02/13/2012  Treatments Tried: Took Tylenol for Headache with some improvement.  Treatments Tried Worked: No OB / GYN:  LMP: 01/31/2012  Guideline(s) Used:  Head Injury  Disposition Per Guideline:   Go to ED Now (or to Office with PCP Approval)  Reason For Disposition Reached:   Sounds like a serious injury to the triager  Advice Given:  N/A  Patient Refused Recommendation:  Patient Refused Care Advice  Caller is home alone with her children. Her husband is not home yet and not available to drive her or take care of children at this time.

## 2012-02-16 NOTE — Telephone Encounter (Signed)
Patient notified as instructed by telephone. Pt said she is OK today; no h/a, no nosebleed and small cut is healing without sign of infection. Pt will call for appt if condition changes.

## 2012-02-16 NOTE — Telephone Encounter (Signed)
If she is still having problems, we could see her today

## 2012-05-20 ENCOUNTER — Ambulatory Visit: Payer: Self-pay | Admitting: Family Medicine

## 2012-06-24 ENCOUNTER — Encounter: Payer: BC Managed Care – PPO | Admitting: Family Medicine

## 2012-06-25 ENCOUNTER — Encounter: Payer: Self-pay | Admitting: Family Medicine

## 2012-06-25 ENCOUNTER — Ambulatory Visit (INDEPENDENT_AMBULATORY_CARE_PROVIDER_SITE_OTHER): Payer: BC Managed Care – PPO | Admitting: Family Medicine

## 2012-06-25 VITALS — BP 120/72 | HR 76 | Temp 98.6°F | Ht 63.5 in | Wt 167.0 lb

## 2012-06-25 DIAGNOSIS — Z111 Encounter for screening for respiratory tuberculosis: Secondary | ICD-10-CM

## 2012-06-25 DIAGNOSIS — Z0289 Encounter for other administrative examinations: Secondary | ICD-10-CM

## 2012-06-25 NOTE — Progress Notes (Signed)
  Subjective:    Patient ID: Morgan Kemp, female    DOB: 06-Jul-1970, 42 y.o.   MRN: [id number]  HPI The patient is here for annual wellness exam and preventative care.   She see GYN for her routine pelvic and breast exam. Last CPX 06/2011.  She is here to have a CPX to determine if she is a good candidate for her family to adopt a 26-67 year old child.  Chronic back pain: stable with exercise stretching. No limitations with lifting. Asthma, exercises induced asthma... Rarely needs inhaler. Depression, well controlled on venlafaxine taking only ever other day. Doing well.  Review of Systems  Constitutional: Negative for fever, fatigue and unexpected weight change.  HENT: Negative for ear pain, congestion, sore throat, sneezing, trouble swallowing and sinus pressure.   Eyes: Negative for pain and itching.  Respiratory: Negative for cough, shortness of breath and wheezing.   Cardiovascular: Negative for chest pain, palpitations and leg swelling.  Gastrointestinal: Negative for nausea, abdominal pain, diarrhea, constipation and blood in stool.  Genitourinary: Negative for dysuria, hematuria, vaginal discharge, difficulty urinating and menstrual problem.  Skin: Negative for rash.  Neurological: Negative for syncope, weakness, light-headedness, numbness and headaches.  Psychiatric/Behavioral: Negative for confusion and dysphoric mood. The patient is not nervous/anxious.        Objective:   Physical Exam  Constitutional: Vital signs are normal. She appears well-developed and well-nourished. She is cooperative.  Non-toxic appearance. She does not appear ill. No distress.  HENT:  Head: Normocephalic.  Right Ear: Hearing, tympanic membrane, external ear and ear canal normal.  Left Ear: Hearing, tympanic membrane, external ear and ear canal normal.  Nose: Nose normal.  Eyes: Conjunctivae, EOM and lids are normal. Pupils are equal, round, and reactive to light. No foreign bodies found.  Neck:  Trachea normal and normal range of motion. Neck supple. Carotid bruit is not present. No mass and no thyromegaly present.  Cardiovascular: Normal rate, regular rhythm, S1 normal, S2 normal, normal heart sounds and intact distal pulses.  Exam reveals no gallop.   No murmur heard. Pulmonary/Chest: Effort normal and breath sounds normal. No respiratory distress. She has no wheezes. She has no rhonchi. She has no rales.  Abdominal: Soft. Normal appearance and bowel sounds are normal. She exhibits no distension, no fluid wave, no abdominal bruit and no mass. There is no hepatosplenomegaly. There is no tenderness. There is no rebound, no guarding and no CVA tenderness. No hernia.  Lymphadenopathy:    She has no cervical adenopathy.    She has no axillary adenopathy.  Neurological: She is alert. She has normal strength. No cranial nerve deficit or sensory deficit.  Skin: Skin is warm, dry and intact. No rash noted.  Psychiatric: Her speech is normal and behavior is normal. Judgment normal. Her mood appears not anxious. Cognition and memory are normal. She does not exhibit a depressed mood.          Assessment & Plan:  The patient's preventative maintenance and recommended screening tests for an annual wellness exam were reviewed in full today. Brought up to date unless services declined.  Counselled on the importance of diet, exercise, and its role in overall health and mortality. The patient's FH and SH was reviewed, including their home life, tobacco status, and drug and alcohol status.   Vaccines: Uptodate Sees GYN for pap.. nml pap 2013  mammogram 2013, had stable spots... Had repeat 1 month ago. HAving DM and chol screen at GYN.

## 2012-06-25 NOTE — Addendum Note (Signed)
Addended by: Consuello Masse on: 06/25/2012 11:56 AM   Modules accepted: Orders

## 2012-06-28 LAB — TB SKIN TEST
Induration: 0 mm
TB Skin Test: NEGATIVE

## 2013-07-22 ENCOUNTER — Other Ambulatory Visit: Payer: Self-pay | Admitting: Family Medicine

## 2013-10-20 ENCOUNTER — Ambulatory Visit: Payer: Self-pay | Admitting: Family Medicine

## 2013-12-13 ENCOUNTER — Ambulatory Visit: Payer: BC Managed Care – PPO | Admitting: Family Medicine

## 2013-12-16 ENCOUNTER — Ambulatory Visit (INDEPENDENT_AMBULATORY_CARE_PROVIDER_SITE_OTHER): Payer: BC Managed Care – PPO | Admitting: Family Medicine

## 2013-12-16 ENCOUNTER — Encounter: Payer: Self-pay | Admitting: Family Medicine

## 2013-12-16 VITALS — BP 100/70 | HR 79 | Temp 98.2°F | Ht 63.0 in | Wt 187.5 lb

## 2013-12-16 DIAGNOSIS — F321 Major depressive disorder, single episode, moderate: Secondary | ICD-10-CM

## 2013-12-16 DIAGNOSIS — R635 Abnormal weight gain: Secondary | ICD-10-CM

## 2013-12-16 DIAGNOSIS — R21 Rash and other nonspecific skin eruption: Secondary | ICD-10-CM

## 2013-12-16 DIAGNOSIS — L299 Pruritus, unspecified: Secondary | ICD-10-CM

## 2013-12-16 DIAGNOSIS — Z13220 Encounter for screening for lipoid disorders: Secondary | ICD-10-CM

## 2013-12-16 DIAGNOSIS — T50905A Adverse effect of unspecified drugs, medicaments and biological substances, initial encounter: Secondary | ICD-10-CM

## 2013-12-16 DIAGNOSIS — R61 Generalized hyperhidrosis: Secondary | ICD-10-CM

## 2013-12-16 LAB — COMPREHENSIVE METABOLIC PANEL
ALT: 16 U/L (ref 0–35)
AST: 19 U/L (ref 0–37)
Albumin: 3.5 g/dL (ref 3.5–5.2)
Alkaline Phosphatase: 55 U/L (ref 39–117)
BUN: 5 mg/dL — ABNORMAL LOW (ref 6–23)
CO2: 26 mEq/L (ref 19–32)
Calcium: 9.1 mg/dL (ref 8.4–10.5)
Chloride: 105 mEq/L (ref 96–112)
Creatinine, Ser: 0.7 mg/dL (ref 0.4–1.2)
GFR: 112.32 mL/min (ref >60.00)
Glucose, Bld: 84 mg/dL (ref 70–99)
Potassium: 4 mEq/L (ref 3.5–5.1)
Sodium: 137 mEq/L (ref 135–145)
Total Bilirubin: 0.7 mg/dL (ref 0.2–1.2)
Total Protein: 7.2 g/dL (ref 6.0–8.3)

## 2013-12-16 LAB — CBC WITH DIFFERENTIAL/PLATELET
Basophils Absolute: 0.1 10*3/uL (ref 0.0–0.1)
Basophils Relative: 1 % (ref 0.0–3.0)
Eosinophils Absolute: 0.7 10*3/uL (ref 0.0–0.7)
Eosinophils Relative: 11.3 % — ABNORMAL HIGH (ref 0.0–5.0)
HCT: 42 % (ref 36.0–46.0)
Hemoglobin: 13.7 g/dL (ref 12.0–15.0)
Lymphocytes Relative: 26.9 % (ref 12.0–46.0)
Lymphs Abs: 1.7 10*3/uL (ref 0.7–4.0)
MCHC: 32.7 g/dL (ref 30.0–36.0)
MCV: 91 fl (ref 78.0–100.0)
Monocytes Absolute: 0.5 10*3/uL (ref 0.1–1.0)
Monocytes Relative: 8.4 % (ref 3.0–12.0)
Neutro Abs: 3.4 10*3/uL (ref 1.4–7.7)
Neutrophils Relative %: 52.4 % (ref 43.0–77.0)
Platelets: 265 10*3/uL (ref 150.0–400.0)
RBC: 4.61 Mil/uL (ref 3.87–5.11)
RDW: 13.7 % (ref 11.5–15.5)
WBC: 6.5 10*3/uL (ref 4.0–10.5)

## 2013-12-16 LAB — LIPID PANEL
Cholesterol: 176 mg/dL (ref 0–200)
HDL: 43.5 mg/dL (ref >39.00)
LDL Cholesterol: 119 mg/dL — ABNORMAL HIGH (ref 0–99)
NonHDL: 132.5
Total CHOL/HDL Ratio: 4
Triglycerides: 70 mg/dL (ref 0.0–149.0)
VLDL: 14 mg/dL (ref 0.0–40.0)

## 2013-12-16 LAB — LUTEINIZING HORMONE: LH: 5.7 m[IU]/mL

## 2013-12-16 LAB — TSH: TSH: 0.5 u[IU]/mL (ref 0.35–4.50)

## 2013-12-16 LAB — FOLLICLE STIMULATING HORMONE: FSH: 2.8 m[IU]/mL

## 2013-12-16 MED ORDER — VENLAFAXINE HCL ER 75 MG PO CP24: ORAL_CAPSULE | ORAL | Status: DC

## 2013-12-16 MED ORDER — EPINEPHRINE 0.3 MG/0.3ML IJ SOAJ: 0.3000 mg | Freq: Once | INTRAMUSCULAR | Status: DC

## 2013-12-16 NOTE — Progress Notes (Signed)
Pre visit review using our clinic review tool, if applicable. No additional management support is needed unless otherwise documented below in the visit note. 

## 2013-12-16 NOTE — Assessment & Plan Note (Signed)
At least in part due to prednisone. Due for chol and DM screen. Continue healthy eating and exercise.

## 2013-12-16 NOTE — Assessment & Plan Note (Signed)
Stable control on effexor 75 mg daily.

## 2013-12-16 NOTE — Progress Notes (Signed)
Subjective:    Patient ID: Morgan Kemp, female    DOB: 12-Oct-1970, 43 y.o.   MRN: [id number]  HPI  43 year old female presents for follow up and medication refill.  Her depression is stable on venlafaxine xr and she is due for an epipen refill.  She has no associated SE from these meds.  She is sleeping moderately well at night and has no SI.   She uses epipen for shellfish and peanut allergy. She has not had to use the epi pen ever.   She would also like to discuss the following new issues:  1.weight issue, she is having difficulty losing weight. Wt Readings from Last 3 Encounters:  12/16/13 187 lb 8 oz (85.049 kg)  06/25/12 167 lb (75.751 kg)  12/05/11 174 lb 8 oz (79.153 kg)  She is on prednisone for itching from a severe dermatitis.  She has been on this off an on since 08/2013, 21 days at a time.  2. ? thyroid issues: She is concerned about this given dermatitis flare.  She has been eating healthy and exercise 3-4 days a week, running. No family history of thyroid issue. Increase in fatigue. She is constipated, uses colon cleanser to control.  She has night sweats, goes from hot to cold. Always cold during the day. Sisters started menopause in early 60s.  Family members with extreme eczema. No other skin issues. No multiple myeloma.      Review of Systems  Constitutional: Positive for unexpected weight change. Negative for fever and fatigue.  HENT: Negative for ear pain.   Eyes: Negative for pain.  Respiratory: Negative for chest tightness and shortness of breath.   Cardiovascular: Negative for chest pain, palpitations and leg swelling.  Gastrointestinal: Negative for abdominal pain.  Genitourinary: Negative for dysuria.       Objective:   Physical Exam  Constitutional: Vital signs are normal. She appears well-developed and well-nourished. She is cooperative.  Non-toxic appearance. She does not appear ill. No distress.  HENT:  Head: Normocephalic.  Right  Ear: Hearing, tympanic membrane, external ear and ear canal normal. Tympanic membrane is not erythematous, not retracted and not bulging.  Left Ear: Hearing, tympanic membrane, external ear and ear canal normal. Tympanic membrane is not erythematous, not retracted and not bulging.  Nose: No mucosal edema or rhinorrhea. Right sinus exhibits no maxillary sinus tenderness and no frontal sinus tenderness. Left sinus exhibits no maxillary sinus tenderness and no frontal sinus tenderness.  Mouth/Throat: Uvula is midline, oropharynx is clear and moist and mucous membranes are normal.  Eyes: Conjunctivae, EOM and lids are normal. Pupils are equal, round, and reactive to light. Lids are everted and swept, no foreign bodies found.  Neck: Trachea normal and normal range of motion. Neck supple. Carotid bruit is not present. No mass and no thyromegaly present.  Cardiovascular: Normal rate, regular rhythm, S1 normal, S2 normal, normal heart sounds, intact distal pulses and normal pulses.  Exam reveals no gallop and no friction rub.   No murmur heard. Pulmonary/Chest: Effort normal and breath sounds normal. Not tachypneic. No respiratory distress. She has no decreased breath sounds. She has no wheezes. She has no rhonchi. She has no rales.  Abdominal: Soft. Normal appearance and bowel sounds are normal. There is no tenderness.  Neurological: She is alert.  Skin: Skin is warm, dry and intact. No rash noted.  Very dry skin, multiple scabs and hyperpigmented lesion, excoriations.  Psychiatric: Her speech is normal and behavior is normal.  Judgment and thought content normal. Her mood appears not anxious. Cognition and memory are normal. She does not exhibit a depressed mood.          Assessment & Plan:

## 2013-12-16 NOTE — Patient Instructions (Signed)
Stop at lab on way out. Continue healthy eating and exercise . Try to increase exercise as able. Limit prednisone use as able.

## 2013-12-16 NOTE — Assessment & Plan Note (Signed)
?  Severe eczema vs other source of severe itching.  Will eval thyroid, cbc. Consider multiple myeloma panel if not improving, but douibt given age.

## 2013-12-21 ENCOUNTER — Encounter: Payer: Self-pay | Admitting: *Deleted

## 2014-03-28 ENCOUNTER — Ambulatory Visit (INDEPENDENT_AMBULATORY_CARE_PROVIDER_SITE_OTHER): Payer: BC Managed Care – PPO | Admitting: Family Medicine

## 2014-03-28 ENCOUNTER — Encounter: Payer: Self-pay | Admitting: Family Medicine

## 2014-03-28 VITALS — BP 110/62 | HR 81 | Temp 98.3°F | Ht 63.0 in | Wt 175.5 lb

## 2014-03-28 DIAGNOSIS — F331 Major depressive disorder, recurrent, moderate: Secondary | ICD-10-CM

## 2014-03-28 MED ORDER — VENLAFAXINE HCL ER 150 MG PO CP24: ORAL_CAPSULE | ORAL | Status: DC

## 2014-03-28 NOTE — Progress Notes (Signed)
Pre visit review using our clinic review tool, if applicable. No additional management support is needed unless otherwise documented below in the visit note. 

## 2014-03-28 NOTE — Patient Instructions (Signed)
Increase venlafaxine to 150 mg daily at bedtime. Continue counseling and journalling. Work on regular exercise.

## 2014-03-28 NOTE — Assessment & Plan Note (Signed)
Increase venlafaxine to 150 mg ER. Follow up in 1 month. Continue counseling, work on regular exercise.

## 2014-03-28 NOTE — Progress Notes (Signed)
   Subjective:    Patient ID: Morgan Kemp, female    DOB: [birth year], 44 y.o.   MRN: [id number]  HPI  44 year old female with  Hx of major depression  presents  To discuss  Her mood.  She states her depression is poorly controlled. She has been on 5 years, but feell not working as well any more.   She is sad, tearful, some anxiety, she is not sleeping well at night. She has lost 15 lbs because she is not eating.  NO SI, no HI. She has anhedonia. Trouble focusing,  Feels stuck. No motivation.  She has stress from work and school. She is currently seeing therapist for counseling.  Wt Readings from Last 3 Encounters:  03/28/14 175 lb 8 oz (79.606 kg)  12/16/13 187 lb 8 oz (85.049 kg)  06/25/12 167 lb (75.751 kg)    She was on zoloft years ago.Marland Kitchen No SE, but not sure it helped.   Review of Systems  Constitutional: Negative for fever and fatigue.  HENT: Negative for ear pain.   Eyes: Negative for pain.  Respiratory: Negative for chest tightness and shortness of breath.   Cardiovascular: Negative for chest pain, palpitations and leg swelling.  Gastrointestinal: Negative for abdominal pain.  Genitourinary: Negative for dysuria.       Objective:   Physical Exam  Constitutional: Vital signs are normal. She appears well-developed and well-nourished. She is cooperative.  Non-toxic appearance. She does not appear ill. No distress.  HENT:  Head: Normocephalic.  Right Ear: Hearing, tympanic membrane, external ear and ear canal normal. Tympanic membrane is not erythematous, not retracted and not bulging.  Left Ear: Hearing, tympanic membrane, external ear and ear canal normal. Tympanic membrane is not erythematous, not retracted and not bulging.  Nose: No mucosal edema or rhinorrhea. Right sinus exhibits no maxillary sinus tenderness and no frontal sinus tenderness. Left sinus exhibits no maxillary sinus tenderness and no frontal sinus tenderness.  Mouth/Throat: Uvula is midline, oropharynx  is clear and moist and mucous membranes are normal.  Eyes: Conjunctivae, EOM and lids are normal. Pupils are equal, round, and reactive to light. Lids are everted and swept, no foreign bodies found.  Neck: Trachea normal and normal range of motion. Neck supple. Carotid bruit is not present. No thyroid mass and no thyromegaly present.  Cardiovascular: Normal rate, regular rhythm, S1 normal, S2 normal, normal heart sounds, intact distal pulses and normal pulses.  Exam reveals no gallop and no friction rub.   No murmur heard. Pulmonary/Chest: Effort normal and breath sounds normal. No tachypnea. No respiratory distress. She has no decreased breath sounds. She has no wheezes. She has no rhonchi. She has no rales.  Abdominal: Soft. Normal appearance and bowel sounds are normal. There is no tenderness.  Neurological: She is alert.  Skin: Skin is warm, dry and intact. No rash noted.  Psychiatric: Her speech is normal. Judgment and thought content normal. Her mood appears not anxious. Her affect is blunt. She is withdrawn. Cognition and memory are normal. She exhibits a depressed mood.          Assessment & Plan:

## 2014-04-25 ENCOUNTER — Ambulatory Visit (INDEPENDENT_AMBULATORY_CARE_PROVIDER_SITE_OTHER): Payer: BC Managed Care – PPO | Admitting: Family Medicine

## 2014-04-25 ENCOUNTER — Encounter: Payer: Self-pay | Admitting: Family Medicine

## 2014-04-25 VITALS — BP 100/60 | HR 90 | Temp 98.4°F | Ht 63.0 in | Wt 176.0 lb

## 2014-04-25 DIAGNOSIS — F5104 Psychophysiologic insomnia: Secondary | ICD-10-CM | POA: Insufficient documentation

## 2014-04-25 DIAGNOSIS — F331 Major depressive disorder, recurrent, moderate: Secondary | ICD-10-CM

## 2014-04-25 DIAGNOSIS — G4700 Insomnia, unspecified: Secondary | ICD-10-CM

## 2014-04-25 DIAGNOSIS — G4709 Other insomnia: Secondary | ICD-10-CM | POA: Insufficient documentation

## 2014-04-25 MED ORDER — TRAZODONE HCL 50 MG PO TABS: 25.0000 mg | ORAL_TABLET | Freq: Every evening | ORAL | Status: DC | PRN

## 2014-04-25 NOTE — Progress Notes (Signed)
Pre visit review using our clinic review tool, if applicable. No additional management support is needed unless otherwise documented below in the visit note. 

## 2014-04-25 NOTE — Patient Instructions (Signed)
Continue venlafaxine at current dose. Can use trazodone for sleep as needed. Work on exercise during the day. No caffeine past 2-3 PM per day. Can use zyrtec at bedtime for sleep.

## 2014-04-25 NOTE — Assessment & Plan Note (Signed)
Excellent control on venlafaxine higher dose. She does now have some insomnia.

## 2014-04-25 NOTE — Progress Notes (Signed)
44 year old female with Hx of major depression presents follow up on mood.   At last OV on 2/9 we increased her venlafaxine to 150 mg daily given her mood was not well controlled. At last OV she was sad, tearful, some anxiety,not sleeping well at night. NO SI, no HI. Had anhedonia. Trouble focusing, Felt stuck. No motivation. She has stress from work and school.  Wt Readings from Last 3 Encounters:  04/25/14 176 lb (79.833 kg)  03/28/14 175 lb 8 oz (79.606 kg)  12/16/13 187 lb 8 oz (85.049 kg)   Today she reports 100% improvement with her mood. She is no longer tearful. She is eating some better. Improved motivation and anhedonia, focusing better. She plans to restart therapy but has not been here in a while.   No SE except she cannot sleep at night.Trouble falling and staying asleep.    Review of Systems  Constitutional: Negative for fever and fatigue.  HENT: Negative for ear pain.  Eyes: Negative for pain.  Respiratory: Negative for chest tightness and shortness of breath.  Cardiovascular: Negative for chest pain, palpitations and leg swelling.  Gastrointestinal: Negative for abdominal pain.  Genitourinary: Negative for dysuria.       Objective:   Physical Exam  Constitutional: Vital signs are normal. She appears well-developed and well-nourished. She is cooperative. Non-toxic appearance. She does not appear ill. No distress.  HENT:  Head: Normocephalic.  Right Ear: Hearing, tympanic membrane, external ear and ear canal normal. Tympanic membrane is not erythematous, not retracted and not bulging.  Left Ear: Hearing, tympanic membrane, external ear and ear canal normal. Tympanic membrane is not erythematous, not retracted and not bulging.  Nose: No mucosal edema or rhinorrhea. Right sinus exhibits no maxillary sinus tenderness and no frontal sinus tenderness. Left sinus exhibits no maxillary sinus tenderness and no frontal sinus tenderness.  Mouth/Throat:  Uvula is midline, oropharynx is clear and moist and mucous membranes are normal.  Eyes: Conjunctivae, EOM and lids are normal. Pupils are equal, round, and reactive to light. Lids are everted and swept, no foreign bodies found.  Neck: Trachea normal and normal range of motion. Neck supple. Carotid bruit is not present. No thyroid mass and no thyromegaly present.  Cardiovascular: Normal rate, regular rhythm, S1 normal, S2 normal, normal heart sounds, intact distal pulses and normal pulses. Exam reveals no gallop and no friction rub.  No murmur heard. Pulmonary/Chest: Effort normal and breath sounds normal. No tachypnea. No respiratory distress. She has no decreased breath sounds. She has no wheezes. She has no rhonchi. She has no rales.  Abdominal: Soft. Normal appearance and bowel sounds are normal. There is no tenderness.  Neurological: She is alert.  Skin: Skin is warm, dry and intact. No rash noted.  Psychiatric: Her speech is normal. Judgment and thought content normal. Her mood appears not happy appearing, nml affect, Nml interaction, no suicidal thoughts

## 2014-04-25 NOTE — Assessment & Plan Note (Signed)
Trial of trazodone for sleep. Increase exercise., Reviewed healthy sleep hygiene.

## 2015-01-24 ENCOUNTER — Telehealth: Payer: Self-pay

## 2015-01-24 NOTE — Telephone Encounter (Signed)
Pt called requesting refill effexor XR 75 mg taking 2 caps daily; pt never got the effexor XR 150 mg sent in 03/2014. Spoke with Jinny Blossom at [facility] and refills available for effexor XR 150 mg taking one daily. Jinny Blossom will get rx ready for pick up and pt notified and voiced understanding that these are 150 mg and will only take one daily.

## 2015-05-01 ENCOUNTER — Other Ambulatory Visit: Payer: Self-pay | Admitting: Family Medicine

## 2015-05-01 NOTE — Telephone Encounter (Addendum)
Last office visit 04/25/2014.  Last refilled 03/28/2014 for #30 with 5 refills.  No future appointments.  Refill?

## 2015-06-12 ENCOUNTER — Other Ambulatory Visit: Payer: Self-pay | Admitting: Family Medicine

## 2015-06-12 NOTE — Telephone Encounter (Signed)
Labs 5/1 cpx 5/4 Pt aware Please close

## 2015-06-12 NOTE — Telephone Encounter (Signed)
Please call and schedule CPE or Medication Refill Appointment with Dr. Diona Browner.

## 2015-06-12 NOTE — Telephone Encounter (Signed)
Make appt refill until then.

## 2015-06-12 NOTE — Telephone Encounter (Signed)
Last office visit 04/25/2014.  No future appointments scheduled.  Refill?

## 2015-06-14 ENCOUNTER — Telehealth: Payer: Self-pay | Admitting: Family Medicine

## 2015-06-14 DIAGNOSIS — Z113 Encounter for screening for infections with a predominantly sexual mode of transmission: Secondary | ICD-10-CM

## 2015-06-14 DIAGNOSIS — Z13220 Encounter for screening for lipoid disorders: Secondary | ICD-10-CM

## 2015-06-14 NOTE — Telephone Encounter (Signed)
Patient is having lab work done for her physical on 06/18/15.  Patient is asking to have std and HIV screenings added to the lab order. Patient would like to be screened for TB as well because she works at a [employer].

## 2015-06-15 NOTE — Telephone Encounter (Signed)
TB screen can be done at appt... Make sure appt not on a Thursday given need for  Recheck in 48-72 hours.  Will add STD screen to labs.  Cancel herpes simplex test if pt has NEVER had any vaginal ulcers or known exposure to herpes. Otherwise this test can be very anxiety provoking and unhelpful.

## 2015-06-15 NOTE — Telephone Encounter (Signed)
Ok to place PPD at lab appointment on Monday 06/18/15.

## 2015-06-18 ENCOUNTER — Telehealth: Payer: Self-pay | Admitting: Family Medicine

## 2015-06-18 ENCOUNTER — Other Ambulatory Visit (INDEPENDENT_AMBULATORY_CARE_PROVIDER_SITE_OTHER): Payer: BLUE CROSS/BLUE SHIELD

## 2015-06-18 DIAGNOSIS — Z111 Encounter for screening for respiratory tuberculosis: Secondary | ICD-10-CM

## 2015-06-18 DIAGNOSIS — Z13220 Encounter for screening for lipoid disorders: Secondary | ICD-10-CM

## 2015-06-18 DIAGNOSIS — Z113 Encounter for screening for infections with a predominantly sexual mode of transmission: Secondary | ICD-10-CM

## 2015-06-18 LAB — COMPREHENSIVE METABOLIC PANEL
ALT: 14 U/L (ref 0–35)
AST: 14 U/L (ref 0–37)
Albumin: 4.4 g/dL (ref 3.5–5.2)
Alkaline Phosphatase: 58 U/L (ref 39–117)
BUN: 5 mg/dL — ABNORMAL LOW (ref 6–23)
CO2: 27 mEq/L (ref 19–32)
Calcium: 9.5 mg/dL (ref 8.4–10.5)
Chloride: 102 mEq/L (ref 96–112)
Creatinine, Ser: 0.73 mg/dL (ref 0.40–1.20)
GFR: 111.52 mL/min (ref >60.00)
Glucose, Bld: 82 mg/dL (ref 70–99)
Potassium: 3.6 mEq/L (ref 3.5–5.1)
Sodium: 137 mEq/L (ref 135–145)
Total Bilirubin: 0.5 mg/dL (ref 0.2–1.2)
Total Protein: 7.8 g/dL (ref 6.0–8.3)

## 2015-06-18 LAB — LIPID PANEL
Cholesterol: 172 mg/dL (ref 0–200)
HDL: 44.9 mg/dL (ref >39.00)
LDL Cholesterol: 115 mg/dL — ABNORMAL HIGH (ref 0–99)
NonHDL: 126.64
Total CHOL/HDL Ratio: 4
Triglycerides: 60 mg/dL (ref 0.0–149.0)
VLDL: 12 mg/dL (ref 0.0–40.0)

## 2015-06-18 LAB — HEPATITIS PANEL, ACUTE
HCV Ab: NEGATIVE
Hep A IgM: NONREACTIVE
Hep B C IgM: NONREACTIVE
Hepatitis B Surface Ag: NEGATIVE

## 2015-06-18 LAB — HIV ANTIBODY (ROUTINE TESTING W REFLEX): HIV 1&2 Ab, 4th Generation: NONREACTIVE

## 2015-06-18 NOTE — Addendum Note (Signed)
Addended by: Daralene Milch C on: 06/18/2015 11:01 AM   Modules accepted: Orders

## 2015-06-18 NOTE — Telephone Encounter (Signed)
-----   Message from Marchia Bond sent at 06/15/2015 10:43 AM EDT ----- Regarding: Cpx lab Mon 5/1, need orders. Thanks! :-) Please order  future cpx labs for pt's upcoming lab appt. Thanks Aniceto Boss

## 2015-06-19 LAB — HSV(HERPES SMPLX)ABS-I+II(IGG+IGM)-BLD
HSV 1 Glycoprotein G Ab, IgG: 15.4 Index — ABNORMAL HIGH (ref <0.90)
HSV 2 Glycoprotein G Ab, IgG: 1.55 Index — ABNORMAL HIGH (ref <0.90)
Herpes Simplex Vrs I&II-IgM Ab (EIA): 0.85 INDEX

## 2015-06-19 LAB — RPR

## 2015-06-21 ENCOUNTER — Other Ambulatory Visit (HOSPITAL_COMMUNITY)
Admission: RE | Admit: 2015-06-21 | Discharge: 2015-06-21 | Disposition: A | Discharge status: Home / Self Care | Payer: BLUE CROSS/BLUE SHIELD | Source: Ambulatory Visit | Attending: Family Medicine | Admitting: Family Medicine

## 2015-06-21 ENCOUNTER — Encounter: Payer: Self-pay | Admitting: Family Medicine

## 2015-06-21 ENCOUNTER — Ambulatory Visit (INDEPENDENT_AMBULATORY_CARE_PROVIDER_SITE_OTHER): Payer: BLUE CROSS/BLUE SHIELD | Admitting: Family Medicine

## 2015-06-21 VITALS — BP 108/60 | HR 87 | Temp 98.5°F | Ht 62.5 in | Wt 201.8 lb

## 2015-06-21 DIAGNOSIS — E669 Obesity, unspecified: Secondary | ICD-10-CM | POA: Insufficient documentation

## 2015-06-21 DIAGNOSIS — G4700 Insomnia, unspecified: Secondary | ICD-10-CM

## 2015-06-21 DIAGNOSIS — J301 Allergic rhinitis due to pollen: Secondary | ICD-10-CM

## 2015-06-21 DIAGNOSIS — R87810 Cervical high risk human papillomavirus (HPV) DNA test positive: Secondary | ICD-10-CM | POA: Insufficient documentation

## 2015-06-21 DIAGNOSIS — Z01411 Encounter for gynecological examination (general) (routine) with abnormal findings: Secondary | ICD-10-CM | POA: Insufficient documentation

## 2015-06-21 DIAGNOSIS — F5104 Psychophysiologic insomnia: Secondary | ICD-10-CM

## 2015-06-21 DIAGNOSIS — Z1151 Encounter for screening for human papillomavirus (HPV): Secondary | ICD-10-CM | POA: Diagnosis present

## 2015-06-21 DIAGNOSIS — F331 Major depressive disorder, recurrent, moderate: Secondary | ICD-10-CM | POA: Diagnosis not present

## 2015-06-21 DIAGNOSIS — Z124 Encounter for screening for malignant neoplasm of cervix: Secondary | ICD-10-CM

## 2015-06-21 DIAGNOSIS — Z0000 Encounter for general adult medical examination without abnormal findings: Secondary | ICD-10-CM

## 2015-06-21 DIAGNOSIS — Z30431 Encounter for routine checking of intrauterine contraceptive device: Secondary | ICD-10-CM | POA: Insufficient documentation

## 2015-06-21 LAB — TB SKIN TEST
Induration: 0 mm
TB Skin Test: NEGATIVE

## 2015-06-21 MED ORDER — EPINEPHRINE 0.3 MG/0.3ML IJ SOAJ: 0.3000 mg | Freq: Once | INTRAMUSCULAR | Status: DC

## 2015-06-21 MED ORDER — TRAZODONE HCL 50 MG PO TABS: 25.0000 mg | ORAL_TABLET | Freq: Every evening | ORAL | Status: DC | PRN

## 2015-06-21 MED ORDER — NORGESTIMATE-ETH ESTRADIOL 0.25-35 MG-MCG PO TABS: 1.0000 | ORAL_TABLET | Freq: Every day | ORAL | Status: DC

## 2015-06-21 NOTE — Assessment & Plan Note (Signed)
Well controlled on zyrtec.

## 2015-06-21 NOTE — Assessment & Plan Note (Addendum)
Well controlled on trazodone. 

## 2015-06-21 NOTE — Assessment & Plan Note (Signed)
IUD removed without complications as was in place 2 years longer than recommended. Likely cause of nonpathologic vaginal discharge. Pt placed on oral contraception and wil consider return to [facility] for IUD replacement.

## 2015-06-21 NOTE — Assessment & Plan Note (Signed)
Well controlled on venlafaxine daily. Working on stress reduction, relaxation.

## 2015-06-21 NOTE — Assessment & Plan Note (Signed)
Encouraged exercise, weight loss, healthy eating habits. ? ?

## 2015-06-21 NOTE — Patient Instructions (Addendum)
Work on healthy eating and regular exercise 3-5 times a week. IUD removed.  Start oral contraception on first Sunday after menses starts. In next 2-3 days if heavy bleeding, severe pain.. Go to ER.

## 2015-06-21 NOTE — Progress Notes (Signed)
Pre visit review using our clinic review tool, if applicable. No additional management support is needed unless otherwise documented below in the visit note. 
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
## 2015-06-22 LAB — CYTOLOGY - PAP

## 2015-06-26 ENCOUNTER — Encounter: Payer: Self-pay | Admitting: *Deleted

## 2015-07-23 ENCOUNTER — Other Ambulatory Visit: Payer: Self-pay | Admitting: Family Medicine

## 2016-04-07 ENCOUNTER — Telehealth: Payer: Self-pay | Admitting: Family Medicine

## 2016-04-07 MED ORDER — VENLAFAXINE HCL ER 150 MG PO CP24: ORAL_CAPSULE | ORAL | 0 refills | Status: DC

## 2016-04-07 NOTE — Telephone Encounter (Signed)
Pt is calling to request a refill of her Effexor.  Thanks.

## 2016-04-07 NOTE — Telephone Encounter (Signed)
Refill sent in as instructed by Dr. Diona Browner.  Drusilla notified refills have been sent to [facility].

## 2016-04-07 NOTE — Telephone Encounter (Signed)
Okay to refill x3 months °

## 2016-12-31 ENCOUNTER — Other Ambulatory Visit: Payer: Self-pay

## 2016-12-31 ENCOUNTER — Telehealth: Payer: Self-pay | Admitting: Family Medicine

## 2016-12-31 MED ORDER — VENLAFAXINE HCL ER 150 MG PO CP24: ORAL_CAPSULE | ORAL | 0 refills | Status: DC

## 2016-12-31 NOTE — Telephone Encounter (Signed)
Copied from [place]. Topic: Quick Communication - See Telephone Encounter >> Dec 31, 2016 12:29 PM Bea Graff, NT wrote: CRM for notification. See Telephone encounter for: Patient wants to see if she can get either a full refill or a partial refill of her efflexer 150? She is out and she is scheduled for an appt on 01/13/17 with Dr. Diona Browner.   12/31/16.

## 2016-12-31 NOTE — Telephone Encounter (Signed)
Refilled Effexor for 30 days only. Pt. Has appointment 01/13/17.

## 2017-01-13 ENCOUNTER — Ambulatory Visit: Payer: BLUE CROSS/BLUE SHIELD | Admitting: Family Medicine

## 2017-01-16 ENCOUNTER — Ambulatory Visit: Payer: Medicaid Other | Admitting: Family Medicine

## 2017-01-29 ENCOUNTER — Encounter: Payer: Self-pay | Admitting: Family Medicine

## 2017-01-29 ENCOUNTER — Other Ambulatory Visit: Payer: Self-pay

## 2017-01-29 ENCOUNTER — Ambulatory Visit (INDEPENDENT_AMBULATORY_CARE_PROVIDER_SITE_OTHER): Payer: 59 | Admitting: Family Medicine

## 2017-01-29 VITALS — BP 104/76 | HR 75 | Temp 98.5°F | Ht 62.75 in | Wt 199.8 lb

## 2017-01-29 DIAGNOSIS — F331 Major depressive disorder, recurrent, moderate: Secondary | ICD-10-CM | POA: Diagnosis not present

## 2017-01-29 DIAGNOSIS — F5104 Psychophysiologic insomnia: Secondary | ICD-10-CM

## 2017-01-29 MED ORDER — VENLAFAXINE HCL ER 150 MG PO CP24: ORAL_CAPSULE | ORAL | 1 refills | Status: DC

## 2017-01-29 NOTE — Progress Notes (Signed)
   Subjective:    Patient ID: Morgan Kemp, female    DOB: [birth year], 46 y.o.   MRN: [id number]  HPI  46 year old female presents for medication refill.  Moderate major depression, recurrent: She is not doing well on venlafaxine, but is only taking every other day due to cost in past. Now can afford to take daily.Marland Kitchen Her mood is only moderately controlled on this dosing.  No SE. But has tried to wean off... Cannot.  She has been on this med for 5 years and does not wish to change. PHQ9 today 11/30. No SI.  She does not have time to go to therapy.  Faith keeps her going. Chronic insomnia.. Off and on. Not a big issue when using melatonin.   Blood pressure 104/76, pulse 75, temperature 98.5 F (36.9 C), temperature source Oral, height 5' 2.75" (1.594 m), weight 199 lb 12 oz (90.6 kg), last menstrual period 01/19/2017.   Review of Systems  Constitutional: Negative for fatigue and fever.  HENT: Negative for ear pain.   Eyes: Negative for pain.  Respiratory: Negative for chest tightness and shortness of breath.   Cardiovascular: Negative for chest pain, palpitations and leg swelling.  Gastrointestinal: Negative for abdominal pain.  Genitourinary: Negative for dysuria.       Objective:   Physical Exam  Constitutional: Vital signs are normal. She appears well-developed and well-nourished. She is cooperative.  Non-toxic appearance. She does not appear ill. No distress.  HENT:  Head: Normocephalic.  Right Ear: Hearing, tympanic membrane, external ear and ear canal normal. Tympanic membrane is not erythematous, not retracted and not bulging.  Left Ear: Hearing, tympanic membrane, external ear and ear canal normal. Tympanic membrane is not erythematous, not retracted and not bulging.  Nose: No mucosal edema or rhinorrhea. Right sinus exhibits no maxillary sinus tenderness and no frontal sinus tenderness. Left sinus exhibits no maxillary sinus tenderness and no frontal sinus tenderness.    Mouth/Throat: Uvula is midline, oropharynx is clear and moist and mucous membranes are normal.  Eyes: Conjunctivae, EOM and lids are normal. Pupils are equal, round, and reactive to light. Lids are everted and swept, no foreign bodies found.  Neck: Trachea normal and normal range of motion. Neck supple. Carotid bruit is not present. No thyroid mass and no thyromegaly present.  Cardiovascular: Normal rate, regular rhythm, S1 normal, S2 normal, normal heart sounds, intact distal pulses and normal pulses. Exam reveals no gallop and no friction rub.  No murmur heard. Pulmonary/Chest: Effort normal and breath sounds normal. No tachypnea. No respiratory distress. She has no decreased breath sounds. She has no wheezes. She has no rhonchi. She has no rales.  Abdominal: Soft. Normal appearance and bowel sounds are normal. There is no tenderness.  Neurological: She is alert.  Skin: Skin is warm, dry and intact. No rash noted.  Psychiatric: Her speech is normal and behavior is normal. Judgment and thought content normal. Her mood appears not anxious. Cognition and memory are normal. She does not exhibit a depressed mood.          Assessment & Plan:

## 2017-01-29 NOTE — Assessment & Plan Note (Signed)
Increase to taking daily. Offered BID form to be more affordable but pt not interested. She is also not interested in changing tpo a different medication.  Stress reduction and relaxation.

## 2017-08-17 DIAGNOSIS — Z13220 Encounter for screening for lipoid disorders: Secondary | ICD-10-CM | POA: Diagnosis not present

## 2017-08-17 DIAGNOSIS — Z79899 Other long term (current) drug therapy: Secondary | ICD-10-CM | POA: Diagnosis not present

## 2017-08-17 DIAGNOSIS — Z0000 Encounter for general adult medical examination without abnormal findings: Secondary | ICD-10-CM | POA: Diagnosis not present

## 2017-11-09 DIAGNOSIS — Z23 Encounter for immunization: Secondary | ICD-10-CM | POA: Diagnosis not present

## 2018-02-05 ENCOUNTER — Other Ambulatory Visit: Payer: Self-pay | Admitting: Family Medicine

## 2018-02-05 DIAGNOSIS — Z1231 Encounter for screening mammogram for malignant neoplasm of breast: Secondary | ICD-10-CM

## 2018-02-19 ENCOUNTER — Ambulatory Visit (INDEPENDENT_AMBULATORY_CARE_PROVIDER_SITE_OTHER): Payer: 59 | Admitting: Obstetrics and Gynecology

## 2018-02-19 ENCOUNTER — Encounter: Payer: Self-pay | Admitting: Obstetrics and Gynecology

## 2018-02-19 ENCOUNTER — Ambulatory Visit
Admission: RE | Admit: 2018-02-19 | Discharge: 2018-02-19 | Disposition: A | Discharge status: Home / Self Care | Payer: 59 | Source: Ambulatory Visit | Attending: Family Medicine | Admitting: Family Medicine

## 2018-02-19 ENCOUNTER — Ambulatory Visit (INDEPENDENT_AMBULATORY_CARE_PROVIDER_SITE_OTHER): Payer: 59

## 2018-02-19 ENCOUNTER — Other Ambulatory Visit (HOSPITAL_COMMUNITY)
Admission: RE | Admit: 2018-02-19 | Discharge: 2018-02-19 | Disposition: A | Discharge status: Home / Self Care | Payer: 59 | Source: Ambulatory Visit | Attending: Obstetrics and Gynecology | Admitting: Obstetrics and Gynecology

## 2018-02-19 VITALS — BP 108/70 | HR 80 | Ht 62.0 in | Wt 194.0 lb

## 2018-02-19 DIAGNOSIS — Z79899 Other long term (current) drug therapy: Secondary | ICD-10-CM | POA: Diagnosis not present

## 2018-02-19 DIAGNOSIS — Z124 Encounter for screening for malignant neoplasm of cervix: Secondary | ICD-10-CM | POA: Diagnosis not present

## 2018-02-19 DIAGNOSIS — Z1239 Encounter for other screening for malignant neoplasm of breast: Secondary | ICD-10-CM

## 2018-02-19 DIAGNOSIS — Z1151 Encounter for screening for human papillomavirus (HPV): Secondary | ICD-10-CM | POA: Insufficient documentation

## 2018-02-19 DIAGNOSIS — D250 Submucous leiomyoma of uterus: Secondary | ICD-10-CM

## 2018-02-19 DIAGNOSIS — D252 Subserosal leiomyoma of uterus: Secondary | ICD-10-CM | POA: Diagnosis not present

## 2018-02-19 DIAGNOSIS — D251 Intramural leiomyoma of uterus: Secondary | ICD-10-CM | POA: Diagnosis not present

## 2018-02-19 DIAGNOSIS — N920 Excessive and frequent menstruation with regular cycle: Secondary | ICD-10-CM

## 2018-02-19 DIAGNOSIS — Z01419 Encounter for gynecological examination (general) (routine) without abnormal findings: Secondary | ICD-10-CM | POA: Diagnosis not present

## 2018-02-19 DIAGNOSIS — Z1231 Encounter for screening mammogram for malignant neoplasm of breast: Secondary | ICD-10-CM | POA: Diagnosis not present

## 2018-02-19 DIAGNOSIS — N83292 Other ovarian cyst, left side: Secondary | ICD-10-CM

## 2018-02-19 NOTE — Progress Notes (Signed)
PCP:  Derinda Late, MD   Chief Complaint  Patient presents with  . Gynecologic Exam    pt cycles are a little bit more frequent (28 days), lots of cramping, headaches, back pain, light headed, first 3 days are pretty intense, passing huge clots, uses tampon and pad and has to change it every 2 hrs x over 6 months     HPI:      [name] is a 48 y.o. No obstetric history on file. who LMP was Patient's last menstrual period was 02/05/2018 (approximate)., presents today for her annual examination.  Her menses are regular every 28-30 days, lasting 4 days, heavy flow with clots.  Dysmenorrhea moderate, occurring before menses and first 1-2 days of flow. Has taken tylenol with some relief, but has had to miss work due to periods. She does not have intermenstrual bleeding. Menses used to be 5 days and lighter flow, no dysmen. Sx are unusual for pt and were most of 2019. No recent thyroid labs but had HgB of 11.8 this AM on PCP labs.  Sex activity: single partner, contraception - none.  Last Pap: January 07, 2016  Results were: no abnormalities /neg HPV DNA. 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 Due for repeat pap today. Hx of STDs: HPV  Last mammogram: today with PCP; awaiting results.  There is no FH of breast cancer. There is no FH of ovarian cancer. The patient does do self-breast exams.  Tobacco use: The patient denies current or previous tobacco use. Alcohol use: social drinker No drug use.  Exercise: moderately active  She does not get adequate calcium and Vitamin D in her diet.  Labs with PCP today.   Past Medical History:  Diagnosis Date  . Allergic rhinitis due to pollen   . Backache, unspecified   . Depressive disorder, not elsewhere classified   . Scoliosis (and kyphoscoliosis), idiopathic     History reviewed. No pertinent surgical history.  Family History  Problem Relation Age of Onset  . Alzheimer's disease Mother   . Osteoporosis Mother   .  Hyperlipidemia Father   . Hypertension Father   . Cancer Father        liver  . Diabetes Brother        type 2  . Hyperlipidemia Brother   . Hypertension Brother   . Bipolar disorder Sister        with phsychotic episodes  . Diabetes Sister        type 2  . Kidney failure Other     Social History   Socioeconomic History  . Marital status: Married    Spouse name: Not on file  . Number of children: 2  . Years of education: Not on file  . Highest education level: Not on file  Occupational History  . 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[employer]  . Financial resource strain: Not on file  . Food insecurity:    Worry: Not on file    Inability: Not on file  . Transportation needs:    Medical: Not on file    Non-medical: Not on file  Tobacco Use  . Smoking status: Never Smoker  . Smokeless tobacco: Never Used  Substance and Sexual Activity  . Alcohol use: Yes    Comment: socially  . Drug use: No  . Sexual activity: Yes    Birth control/protection: None  Lifestyle  . Physical activity:  Days per week: Not on file    Minutes per session: Not on file  . Stress: Not on file  Relationships  . Social connections:    Talks on phone: Not on file    Gets together: Not on file    Attends religious service: Not on file    Active member of club or organization: Not on file    Attends meetings of clubs or organizations: Not on file    Relationship status: Not on file  . Intimate partner violence:    Fear of current or ex partner: Not on file    Emotionally abused: Not on file    Physically abused: Not on file    Forced sexual activity: Not on file  Other Topics Concern  . Not on file  Social History Narrative   Regular exercise-yes, walking daily, goes to gym 3 times per week   Diet:fruit and veggies, love ice cream, drinks water 32 oz a day, limited calcium intake    Outpatient Medications Prior to Visit  Medication Sig Dispense  Refill  . cetirizine (ZYRTEC) 10 MG tablet Take 10 mg by mouth daily.    Marland Kitchen desonide (DESOWEN) 0.05 % ointment Apply 2 times daily to the affected areas on the face.    . loratadine (CLARITIN) 10 MG tablet Take 10 mg by mouth daily.    Marland Kitchen triamcinolone ointment (KENALOG) 0.1 % Apply 2 times daily to the affected areas on the body.    . venlafaxine XR (EFFEXOR-XR) 150 MG 24 hr capsule TAKE ONE (1) CAPSULE BY MOUTH EACH DAY 90 capsule 1  . Melatonin 10 MG TABS Take 1 tablet by mouth at bedtime as needed.     No facility-administered medications prior to visit.       ROS:  Review of Systems  Constitutional: Negative for fatigue, fever and unexpected weight change.  Respiratory: Negative for cough, shortness of breath and wheezing.   Cardiovascular: Negative for chest pain, palpitations and leg swelling.  Gastrointestinal: Negative for blood in stool, constipation, diarrhea, nausea and vomiting.  Endocrine: Negative for cold intolerance, heat intolerance and polyuria.  Genitourinary: Positive for menstrual problem. Negative for dyspareunia, dysuria, flank pain, frequency, genital sores, hematuria, pelvic pain, urgency, vaginal bleeding, vaginal discharge and vaginal pain.  Musculoskeletal: Negative for back pain, joint swelling and myalgias.  Skin: Negative for rash.  Neurological: Negative for dizziness, syncope, light-headedness, numbness and headaches.  Hematological: Negative for adenopathy.  Psychiatric/Behavioral: Negative for agitation, confusion, sleep disturbance and suicidal ideas. The patient is not nervous/anxious.   BREAST: No symptoms   Objective: BP 108/70   Pulse 80   Ht 5\' 2"  (1.575 m)   Wt 194 lb (88 kg)   LMP 02/05/2018 (Approximate)   BMI 35.48 kg/m    Physical Exam Constitutional:      Appearance: She is well-developed.  Genitourinary:     Vulva, vagina, cervix, right adnexa and left adnexa normal.     No vulval lesion or tenderness noted.     No vaginal  discharge, erythema or tenderness.     No cervical polyp.     Uterus is enlarged.     Uterus is not tender.     No right or left adnexal mass present.     Right adnexa not tender.     Left adnexa not tender.  Neck:     Musculoskeletal: Normal range of motion.     Thyroid: No thyromegaly.  Cardiovascular:     Rate and  Rhythm: Normal rate and regular rhythm.     Heart sounds: Normal heart sounds. No murmur.  Pulmonary:     Effort: Pulmonary effort is normal.     Breath sounds: Normal breath sounds.  Chest:     Breasts:        Right: No mass, nipple discharge, skin change or tenderness.        Left: No mass, nipple discharge, skin change or tenderness.  Abdominal:     Palpations: Abdomen is soft.     Tenderness: There is no abdominal tenderness. There is no guarding.  Musculoskeletal: Normal range of motion.  Neurological:     Mental Status: She is alert and oriented to person, place, and time.     Cranial Nerves: No cranial nerve deficit.  Psychiatric:        Behavior: Behavior normal.  Vitals signs reviewed.     Assessment/Plan: Encounter for annual routine gynecological examination  Cervical cancer screening - Plan: Cytology - PAP  Screening for HPV (human papillomavirus) - Plan: Cytology - PAP  Screening for breast cancer - Done today, ordered by PCP.  Menorrhagia with regular cycle - Slightly anemic on labs today. Add MVI with iron. Check TSH/prolactin/GYN u/s for poss leio. Will f/u wiht results and mgmt options.  - Plan: US PELVIS TRANSVANGINAL NON-OB (TV ONLY), TSH, Prolactin         GYN counsel breast self exam, mammography screening, menopause, adequate intake of calcium and vitamin D, diet and exercise     F/U  Return in about 1 year (around 02/20/2019).  Alicia B. Copland, PA-C 02/19/2018 3:54 PM

## 2018-02-19 NOTE — Patient Instructions (Signed)
I value your feedback and entrusting us with your care. If you get a [facility] patient survey, I would appreciate you taking the time to let us know about your experience today. Thank you! 

## 2018-02-20 LAB — TSH: TSH: 0.568 u[IU]/mL (ref 0.450–4.500)

## 2018-02-20 LAB — PROLACTIN: Prolactin: 15.6 ng/mL (ref 4.8–23.3)

## 2018-02-22 ENCOUNTER — Other Ambulatory Visit: Payer: Self-pay | Admitting: Family Medicine

## 2018-02-22 ENCOUNTER — Telehealth: Payer: Self-pay | Admitting: Obstetrics and Gynecology

## 2018-02-22 DIAGNOSIS — R928 Other abnormal and inconclusive findings on diagnostic imaging of breast: Secondary | ICD-10-CM

## 2018-02-22 DIAGNOSIS — N6489 Other specified disorders of breast: Secondary | ICD-10-CM

## 2018-02-22 NOTE — Telephone Encounter (Signed)
Patient is calling for labs results. Please advise. 

## 2018-02-23 NOTE — Telephone Encounter (Signed)
Pt aware of neg menorrhagia labs and 5 small leio on u/s. Pt denies DUB. Discussed BC, depo, IUD, Lysteda, ablation (pt may not be candidate given leio but would check with MD if desires this option).  Did OCPs in past without problems, had wt gain with depo, and had Mirena until 2016. Had monthly periods with Mirena that were normal flow. No hx of HTN, DVTs, migraines.  Pt to consider and f/u with decision. Considering BC.

## 2018-02-24 LAB — CYTOLOGY - PAP
Diagnosis: NEGATIVE
HPV: NOT DETECTED

## 2018-02-25 ENCOUNTER — Ambulatory Visit
Admission: RE | Admit: 2018-02-25 | Discharge: 2018-02-25 | Disposition: A | Discharge status: Home / Self Care | Payer: 59 | Source: Ambulatory Visit | Attending: Family Medicine | Admitting: Family Medicine

## 2018-02-25 DIAGNOSIS — N6489 Other specified disorders of breast: Secondary | ICD-10-CM

## 2018-02-25 DIAGNOSIS — R928 Other abnormal and inconclusive findings on diagnostic imaging of breast: Secondary | ICD-10-CM | POA: Insufficient documentation

## 2018-03-01 ENCOUNTER — Other Ambulatory Visit: Payer: Self-pay | Admitting: Family Medicine

## 2018-03-01 DIAGNOSIS — N6489 Other specified disorders of breast: Secondary | ICD-10-CM

## 2018-03-02 ENCOUNTER — Telehealth: Payer: Self-pay

## 2018-03-02 ENCOUNTER — Other Ambulatory Visit: Payer: Self-pay | Admitting: Obstetrics and Gynecology

## 2018-03-02 MED ORDER — ETONOGESTREL-ETHINYL ESTRADIOL 0.12-0.015 MG/24HR VA RING: VAGINAL_RING | VAGINAL | 3 refills | Status: DC

## 2018-03-02 NOTE — Progress Notes (Signed)
Nuvaring start for menorrhagia after neg eval, except 5 small leio. No DUB/contraindications against estrogen products. Start with menses. F/u prn sx.

## 2018-03-02 NOTE — Telephone Encounter (Signed)
Pt had u/s done that showed 5 small fibroids.  She has decided on using nuvaring to curb pain from cycles.  Please send rx.  [phone]

## 2018-03-02 NOTE — Telephone Encounter (Signed)
Rx nuvaring eRxd. Start with next menses (after heavy days). May have BTB first few months. Give it about 3-4 months to see if sx improve. F/u prn.

## 2018-03-02 NOTE — Telephone Encounter (Signed)
Please advise 

## 2018-03-02 NOTE — Telephone Encounter (Signed)
Called pt and no answer, [facility].

## 2018-03-03 NOTE — Telephone Encounter (Signed)
Pt aware.

## 2018-03-03 NOTE — Telephone Encounter (Signed)
Tried again, [facility].

## 2018-03-11 DIAGNOSIS — J302 Other seasonal allergic rhinitis: Secondary | ICD-10-CM | POA: Diagnosis not present

## 2018-03-11 DIAGNOSIS — L209 Atopic dermatitis, unspecified: Secondary | ICD-10-CM | POA: Diagnosis not present

## 2019-03-21 ENCOUNTER — Other Ambulatory Visit: Payer: Self-pay | Admitting: Family Medicine

## 2019-03-21 DIAGNOSIS — N6489 Other specified disorders of breast: Secondary | ICD-10-CM

## 2019-03-21 DIAGNOSIS — Z1231 Encounter for screening mammogram for malignant neoplasm of breast: Secondary | ICD-10-CM

## 2019-03-31 ENCOUNTER — Ambulatory Visit
Admission: RE | Admit: 2019-03-31 | Discharge: 2019-03-31 | Disposition: A | Discharge status: Home / Self Care | Payer: BC Managed Care – PPO | Source: Ambulatory Visit | Attending: Family Medicine | Admitting: Family Medicine

## 2019-03-31 DIAGNOSIS — N6489 Other specified disorders of breast: Secondary | ICD-10-CM

## 2019-03-31 DIAGNOSIS — Z1231 Encounter for screening mammogram for malignant neoplasm of breast: Secondary | ICD-10-CM

## 2019-08-10 ENCOUNTER — Other Ambulatory Visit: Payer: Self-pay

## 2019-08-10 ENCOUNTER — Ambulatory Visit (INDEPENDENT_AMBULATORY_CARE_PROVIDER_SITE_OTHER): Payer: BC Managed Care – PPO | Admitting: Obstetrics and Gynecology

## 2019-08-10 ENCOUNTER — Encounter: Payer: Self-pay | Admitting: Obstetrics and Gynecology

## 2019-08-10 VITALS — BP 110/80 | Ht 62.0 in | Wt 196.0 lb

## 2019-08-10 DIAGNOSIS — Z1231 Encounter for screening mammogram for malignant neoplasm of breast: Secondary | ICD-10-CM

## 2019-08-10 DIAGNOSIS — Z01419 Encounter for gynecological examination (general) (routine) without abnormal findings: Secondary | ICD-10-CM

## 2019-08-10 DIAGNOSIS — Z1211 Encounter for screening for malignant neoplasm of colon: Secondary | ICD-10-CM

## 2019-08-10 DIAGNOSIS — N920 Excessive and frequent menstruation with regular cycle: Secondary | ICD-10-CM | POA: Diagnosis not present

## 2019-08-10 DIAGNOSIS — Z30011 Encounter for initial prescription of contraceptive pills: Secondary | ICD-10-CM

## 2019-08-10 MED ORDER — MICROGESTIN 24 FE 1-20 MG-MCG PO TABS: 1.0000 | ORAL_TABLET | Freq: Every day | ORAL | 3 refills | Status: DC

## 2019-08-10 NOTE — Progress Notes (Signed)
PCP:  Derinda Late, MD   Chief Complaint  Patient presents with  . Gynecologic Exam  . STD testing     HPI:      [name] is a 49 y.o. No obstetric history on file. who LMP was Patient's last menstrual period was 07/24/2019 (exact date)., presents today for her annual examination.  Her menses are regular every 28-30 days, lasting 4-5 days, mod to heavy flow with clots, changing products every 2 hrs.  Dysmenorrhea moderate to severe, occurring before menses and first 1-2 days of flow. Has taken tylenol with some relief, but has had to miss work due to periods (no non-menstrual pelvic pain). She does not have intermenstrual bleeding. Menses used to be 5 days and lighter flow, no dysmen. GYN u/s done last yr (see results below) and pt tried nuvaring for cycle control. It got "lost" inside but eventually she was able to remove it. Didn't do it anymore. Did OCPs in past without problems, had wt gain with depo, and had Mirena until 2016. Had monthly periods with Mirena that were normal flow. No hx of HTN, DVTs, migraines with aura, tob use.  1/20 GYN u/s  Impression: 1. Enlarged, heterogeneous uterus with multiple fibroids with ill-defined borders. 2. Heterogeneous endometrium. 3. Complex left ovarian cyst measuring 1.9cm. 4. Dilated left adnexal vessels, questionable left pelvic congestion.   Sex activity: single partner, contraception - none.  Last Pap: 02/19/18  Results were: no abnormalities /neg HPV DNA. 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  Hx of STDs: HPV on pap  Last mammogram: 03/31/19 with PCP; stable LT breast asymmetry, repeat in 12 months.  There is no FH of breast cancer. There is no FH of ovarian cancer. The patient does do self-breast exams.  Tobacco use: The patient denies current or previous tobacco use. Alcohol use: none No drug use.  Exercise: moderately active  She does get adequate calcium but not Vitamin D in her diet.  Colonoscopy:  never  Labs with PCP   Past Medical History:  Diagnosis Date  . Allergic rhinitis due to pollen   . Backache, unspecified   . Depressive disorder, not elsewhere classified   . Scoliosis (and kyphoscoliosis), idiopathic     History reviewed. No pertinent surgical history.  Family History  Problem Relation Age of Onset  . Alzheimer's disease Mother   . Osteoporosis Mother   . Hyperlipidemia Father   . Hypertension Father   . Cancer Father        liver  . Diabetes Brother        type 2  . Hyperlipidemia Brother   . Hypertension Brother   . Bipolar disorder Sister        with phsychotic episodes  . Diabetes Sister        type 2  . Kidney failure Other   . Breast cancer Neg Hx     Social History   Socioeconomic History  . Marital status: Married    Spouse name: Not on file  . Number of children: 2  . Years of education: Not on file  . Highest education level: Not on file  Occupational History  . 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[employer]  Tobacco Use  . Smoking status: Never Smoker  . Smokeless tobacco: Never Used  Vaping Use  . Vaping Use: Never used  Substance and Sexual Activity  . Alcohol use: Yes    Comment: socially  .  Drug use: No  . Sexual activity: Yes    Birth control/protection: None  Other Topics Concern  . Not on file  Social History Narrative   Regular exercise-yes, walking daily, goes to gym 3 times per week   Diet:fruit and veggies, love ice cream, drinks water 32 oz a day, limited calcium intake   Social Determinants of Health   Financial Resource Strain:   . Difficulty of Paying Living Expenses:   Food Insecurity:   . Worried About Charity fundraiser in the Last Year:   . Arboriculturist in the Last Year:   Transportation Needs:   . Film/video editor (Medical):   Marland Kitchen Lack of Transportation (Non-Medical):   Physical Activity:   . Days of Exercise per Week:   . Minutes of Exercise per Session:   Stress:    . Feeling of Stress :   Social Connections:   . Frequency of Communication with Friends and Family:   . Frequency of Social Gatherings with Friends and Family:   . Attends Religious Services:   . Active Member of Clubs or Organizations:   . Attends Archivist Meetings:   Marland Kitchen Marital Status:   Intimate Partner Violence:   . Fear of Current or Ex-Partner:   . Emotionally Abused:   Marland Kitchen Physically Abused:   . Sexually Abused:     Outpatient Medications Prior to Visit  Medication Sig Dispense Refill  . cetirizine (ZYRTEC) 10 MG tablet Take 10 mg by mouth daily.    . clobetasol ointment (TEMOVATE) 0.05 % Apply topically 2 (two) times daily.    Marland Kitchen desonide (DESOWEN) 0.05 % ointment Apply 2 times daily to the affected areas on the face.    . DUPIXENT 300 MG/2ML SOPN Inject into the skin.    Marland Kitchen escitalopram (LEXAPRO) 10 MG tablet Take 10 mg by mouth daily.    Marland Kitchen loratadine (CLARITIN) 10 MG tablet Take 10 mg by mouth daily.    Marland Kitchen triamcinolone cream (KENALOG) 0.1 % Apply topically 2 (two) times daily.    Marland Kitchen etonogestrel-ethinyl estradiol (NUVARING) 0.12-0.015 MG/24HR vaginal ring Insert vaginally and leave in place for 3 consecutive weeks, then remove for 1 week. 3 each 3  . triamcinolone ointment (KENALOG) 0.1 % Apply 2 times daily to the affected areas on the body.    . venlafaxine XR (EFFEXOR-XR) 150 MG 24 hr capsule TAKE ONE (1) CAPSULE BY MOUTH EACH DAY 90 capsule 1   No facility-administered medications prior to visit.      ROS:  Review of Systems  Constitutional: Negative for fatigue, fever and unexpected weight change.  Respiratory: Negative for cough, shortness of breath and wheezing.   Cardiovascular: Negative for chest pain, palpitations and leg swelling.  Gastrointestinal: Negative for blood in stool, constipation, diarrhea, nausea and vomiting.  Endocrine: Negative for cold intolerance, heat intolerance and polyuria.  Genitourinary: Positive for menstrual problem.  Negative for dyspareunia, dysuria, flank pain, frequency, genital sores, hematuria, pelvic pain, urgency, vaginal bleeding, vaginal discharge and vaginal pain.  Musculoskeletal: Negative for back pain, joint swelling and myalgias.  Skin: Negative for rash.  Neurological: Negative for dizziness, syncope, light-headedness, numbness and headaches.  Hematological: Negative for adenopathy.  Psychiatric/Behavioral: Negative for agitation, confusion, sleep disturbance and suicidal ideas. The patient is not nervous/anxious.   BREAST: No symptoms   Objective: BP 110/80   Ht 5\' 2"  (1.575 m)   Wt 196 lb (88.9 kg)   LMP 07/24/2019 (Exact Date)   BMI  35.85 kg/m    Physical Exam Constitutional:      Appearance: She is well-developed.  Genitourinary:     Vulva, vagina, cervix, right adnexa and left adnexa normal.     No vulval lesion or tenderness noted.     No vaginal discharge, erythema or tenderness.     No cervical polyp.     Uterus is enlarged.     Uterus is not tender.     No right or left adnexal mass present.     Right adnexa not tender.     Left adnexa not tender.  Neck:     Thyroid: No thyromegaly.  Cardiovascular:     Rate and Rhythm: Normal rate and regular rhythm.     Heart sounds: Normal heart sounds. No murmur heard.   Pulmonary:     Effort: Pulmonary effort is normal.     Breath sounds: Normal breath sounds.  Chest:     Breasts:        Right: No mass, nipple discharge, skin change or tenderness.        Left: No mass, nipple discharge, skin change or tenderness.  Abdominal:     Palpations: Abdomen is soft.     Tenderness: There is no abdominal tenderness. There is no guarding.  Musculoskeletal:        General: Normal range of motion.     Cervical back: Normal range of motion.  Neurological:     General: No focal deficit present.     Mental Status: She is alert and oriented to person, place, and time.     Cranial Nerves: No cranial nerve deficit.  Skin:     General: Skin is warm and dry.  Psychiatric:        Mood and Affect: Mood normal.        Behavior: Behavior normal.        Thought Content: Thought content normal.        Judgment: Judgment normal.  Vitals reviewed.     Assessment/Plan: Encounter for annual routine gynecological examination  Encounter for screening mammogram for malignant neoplasm of breast; pt current on mammo with PCP  Encounter for initial prescription of contraceptive pills--OCP start this cycle. No contraindications. Rx lomedia. F/u prn.   Menorrhagia with regular cycle - Plan: Norethindrone Acetate-Ethinyl Estrad-FE (MICROGESTIN 24 FE) 1-20 MG-MCG(24) tablet; Didn't do well with nuvaring, wants to try OCPs. Rx eRxd. F/u prn.   Screening for colon cancer - Plan: Ambulatory referral to Gastroenterology         Meds ordered this encounter  Medications  . Norethindrone Acetate-Ethinyl Estrad-FE (MICROGESTIN 24 FE) 1-20 MG-MCG(24) tablet    Sig: Take 1 tablet by mouth daily.    Dispense:  84 tablet    Refill:  3    Order Specific Question:   Supervising Provider    Answer:   Gae Dry [[id number]]    GYN counsel breast self exam, mammography screening, menopause, adequate intake of calcium and vitamin D, diet and exercise     F/U  Return in about 1 year (around 08/09/2020).  Cervando Durnin B. Kacper Cartlidge, PA-C 08/10/2019 11:47 AM

## 2019-08-10 NOTE — Patient Instructions (Signed)
I value your feedback and entrusting us with your care. If you get a [facility] patient survey, I would appreciate you taking the time to let us know about your experience today. Thank you!  As of January 27, 2019, your lab results will be released to your MyChart immediately, before I even have a chance to see them. Please give me time to review them and contact you if there are any abnormalities. Thank you for your patience.  

## 2019-08-18 ENCOUNTER — Telehealth: Payer: BC Managed Care – PPO

## 2020-03-01 ENCOUNTER — Other Ambulatory Visit: Payer: Self-pay

## 2020-03-02 ENCOUNTER — Other Ambulatory Visit: Payer: Self-pay

## 2020-03-02 DIAGNOSIS — Z20822 Contact with and (suspected) exposure to covid-19: Secondary | ICD-10-CM

## 2020-03-07 LAB — NOVEL CORONAVIRUS, NAA: SARS-CoV-2, NAA: NOT DETECTED

## 2020-05-01 ENCOUNTER — Emergency Department
Admission: EM | Admit: 2020-05-01 | Discharge: 2020-05-01 | Disposition: A | Discharge status: Home / Self Care | Payer: BC Managed Care – PPO | Attending: Emergency Medicine | Admitting: Emergency Medicine

## 2020-05-01 ENCOUNTER — Other Ambulatory Visit: Payer: Self-pay

## 2020-05-01 DIAGNOSIS — T7840XA Allergy, unspecified, initial encounter: Secondary | ICD-10-CM

## 2020-05-01 DIAGNOSIS — L509 Urticaria, unspecified: Secondary | ICD-10-CM

## 2020-05-01 DIAGNOSIS — T783XXA Angioneurotic edema, initial encounter: Secondary | ICD-10-CM | POA: Insufficient documentation

## 2020-05-01 MED ORDER — METHYLPREDNISOLONE SODIUM SUCC 125 MG IJ SOLR: 125.0000 mg | Freq: Once | INTRAMUSCULAR | Status: AC

## 2020-05-01 MED ORDER — SODIUM CHLORIDE 0.9 % IV BOLUS
1000.0000 mL | Freq: Once | INTRAVENOUS | Status: AC
  Administered 2020-05-01: 1000 mL via INTRAVENOUS

## 2020-05-01 MED ORDER — FAMOTIDINE 20 MG PO TABS: 20.0000 mg | ORAL_TABLET | Freq: Two times a day (BID) | ORAL | 0 refills | Status: DC

## 2020-05-01 MED ORDER — METHYLPREDNISOLONE SODIUM SUCC 125 MG IJ SOLR
INTRAMUSCULAR | Status: AC
  Administered 2020-05-01: 125 mg via INTRAVENOUS
  Filled 2020-05-01: qty 2

## 2020-05-01 MED ORDER — DIPHENHYDRAMINE HCL 50 MG/ML IJ SOLN
INTRAMUSCULAR | Status: AC
  Administered 2020-05-01: 25 mg via INTRAVENOUS
  Filled 2020-05-01: qty 1

## 2020-05-01 MED ORDER — EPINEPHRINE 0.3 MG/0.3ML IJ SOAJ: 0.3000 mg | Freq: Once | INTRAMUSCULAR | Status: AC

## 2020-05-01 MED ORDER — FAMOTIDINE IN NACL 20-0.9 MG/50ML-% IV SOLN
20.0000 mg | Freq: Once | INTRAVENOUS | Status: AC
  Administered 2020-05-01: 20 mg via INTRAVENOUS

## 2020-05-01 MED ORDER — EPINEPHRINE 0.3 MG/0.3ML IJ SOAJ
INTRAMUSCULAR | Status: AC
  Administered 2020-05-01: 0.3 mg via INTRAMUSCULAR
  Filled 2020-05-01: qty 0.3

## 2020-05-01 MED ORDER — EPINEPHRINE 0.3 MG/0.3ML IJ SOAJ: 0.3000 mg | Freq: Once | INTRAMUSCULAR | 2 refills | Status: AC

## 2020-05-01 MED ORDER — PREDNISONE 20 MG PO TABS: ORAL_TABLET | ORAL | 0 refills | Status: DC

## 2020-05-01 MED ORDER — DIPHENHYDRAMINE HCL 50 MG/ML IJ SOLN: 25.0000 mg | Freq: Once | INTRAMUSCULAR | Status: AC

## 2020-05-01 NOTE — Discharge Instructions (Addendum)
1. Take the following medicines for the next 4 days: Prednisone 60mg daily Pepcid 20mg twice daily 2. Take Benadryl as needed for itching. 3. Use Epi-Pen in case of acute, life-threatening allergic reaction. 4. Return to the ER for worsening symptoms, persistent vomiting, difficulty breathing or other concerns.  

## 2020-05-01 NOTE — ED Triage Notes (Addendum)
Pt states she ate some cookies around 30 min pta and immediately started itching all over, facial swelling and throat itching. 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

## 2020-05-01 NOTE — ED Notes (Signed)
Pt updated on plan of care, family at bedside. Pt repositioned in bed at this time. Pt denies any needs

## 2020-05-01 NOTE — ED Notes (Signed)
Pt given warm blanket.

## 2020-05-01 NOTE — ED Provider Notes (Signed)
[facility] Emergency Department Provider Note   ____________________________________________   Event Date/Time   First MD Initiated Contact with Patient 05/01/20 0030     (approximate)  I have reviewed the triage vital signs and the nursing notes.   HISTORY  Chief Complaint Allergic Reaction    HPI Wileen Duncanson is a 50 y.o. female who presents to the ED from home with a chief complaint of allergic reaction.  Patient is allergic to shellfish and nuts; ate cookies containing hazelnut approximately 45 minutes prior to arrival.  Immediately began to itch all over with swelling under her eyes and sensation of throat constriction and shortness of breath.  Does not have an EpiPen at home.  Took some expired Benadryl prior to arrival.  Denies chest pain, abdominal pain, nausea, vomiting or diarrhea.     Past medical history None  There are no problems to display for this patient.   Prior to Admission medications   Medication Sig Start Date End Date Taking? Authorizing Provider  EPINEPHrine 0.3 mg/0.3 mL IJ SOAJ injection Inject 0.3 mg into the muscle once for 1 dose. 05/01/20 05/01/20 Yes Paulette Blanch, MD  famotidine (PEPCID) 20 MG tablet Take 1 tablet (20 mg total) by mouth 2 (two) times daily. 05/01/20  Yes Paulette Blanch, MD  predniSONE (DELTASONE) 20 MG tablet 3 tablets daily x 4 days 05/01/20  Yes Paulette Blanch, MD    Allergies Patient has no allergy information on record.  No family history on file.  Social History    Review of Systems  Constitutional: No fever/chills Eyes: Positive for swelling under both eyes.  No visual changes. ENT: Positive for sensation of throat constriction.  No sore throat. Cardiovascular: Denies chest pain. Respiratory: Positive for shortness of breath. Gastrointestinal: No abdominal pain.  No nausea, no vomiting.  No diarrhea.  No constipation. Genitourinary: Negative for dysuria. Musculoskeletal: Negative for  back pain. Skin: Positive for rash. Neurological: Negative for headaches, focal weakness or numbness.   ____________________________________________   PHYSICAL EXAM:  VITAL SIGNS: ED Triage Vitals  Enc Vitals Group     BP 05/01/20 0021 130/62     Pulse Rate 05/01/20 0021 86     Resp 05/01/20 0021 20     Temp 05/01/20 0021 98.7 F (37.1 C)     Temp Source 05/01/20 0021 Oral     SpO2 05/01/20 0021 100 %     Weight 05/01/20 0022 190 lb (86.2 kg)     Height 05/01/20 0022 5\' 2"  (1.575 m)     Head Circumference --      Peak Flow --      Pain Score 05/01/20 0022 0     Pain Loc --      Pain Edu? --      Excl. in [place]? --     Constitutional: Alert and oriented. Well appearing and in mild to moderate acute distress. Eyes: Mild edema beneath both eyes. Conjunctivae are normal. PERRL. EOMI. Head: Atraumatic. Nose: No congestion/rhinnorhea. Mouth/Throat: Mucous membranes are moist.  No tongue or lip angioedema.  There is no hoarse or muffled voice.  There is no drooling.  Patient tolerating secretions well.   Neck: No stridor.  No palpable neck mass. Cardiovascular: Normal rate, regular rhythm. Grossly normal heart sounds.  Good peripheral circulation. Respiratory: Normal respiratory effort.  No retractions. Lungs CTAB. Gastrointestinal: Soft and nontender. No distention. No abdominal bruits. No CVA tenderness. Musculoskeletal: No lower extremity tenderness nor edema.  No joint effusions. Neurologic:  Normal speech and language. No gross focal neurologic deficits are appreciated.  Skin:  Skin is warm, dry and intact.  Diffuse urticaria noted. Psychiatric: Mood and affect are normal. Speech and behavior are normal.  ____________________________________________   LABS (all labs ordered are listed, but only abnormal results are displayed)  Labs Reviewed - No data to  display ____________________________________________  EKG  None ____________________________________________  RADIOLOGY I, Quay Simkin J, personally viewed and evaluated these images (plain radiographs) as part of my medical decision making, as well as reviewing the written report by the radiologist.  ED MD interpretation: None  Official radiology report(s): No results found.  ____________________________________________   PROCEDURES  Procedure(s) performed (including Critical Care):  Procedures   ____________________________________________   INITIAL IMPRESSION / ASSESSMENT AND PLAN / ED COURSE  As part of my medical decision making, I reviewed the following data within the electronic MEDICAL RECORD NUMBER History obtained from family, Nursing notes reviewed and incorporated, Old chart reviewed and Notes from prior ED visits     50 year old female presenting with acute allergic reaction with urticaria and angioedema.  Will initiate IV cocktail to include Solu-Medrol, Benadryl, Pepcid; EpiPen and IV fluids.  Will monitor in the emergency department for a minimum of 3 hours.  Clinical Course as of 05/01/20 0500  Tue May 01, 2020  0150 Patient feeling significantly better. Hives and periorbital edema resolved. Had one episode of emesis but declines anti-emetic as she is no longer nauseous. Will continue to monitor in the emergency department. [JS]  2694 Hives and periorbital edema resolved.  Room air saturations 99%.  Patient feeling much better and eager for discharge home.  Will discharge home with prescriptions for EpiPen, Prednisone, Pepcid and encourage patient to take Benadryl as needed.  Strict return precautions given.  Patient verbalizes understanding and agrees with plan of care. [JS]    Clinical Course User Index [JS] Paulette Blanch, MD     ____________________________________________   FINAL CLINICAL IMPRESSION(S) / ED DIAGNOSES  Final diagnoses:  Allergic  reaction, initial encounter  Angioedema, initial encounter  [place]     ED Discharge Orders         Ordered    EPINEPHrine 0.3 mg/0.3 mL IJ SOAJ injection   Once        05/01/20 0310    predniSONE (DELTASONE) 20 MG tablet        05/01/20 0310    famotidine (PEPCID) 20 MG tablet  2 times daily        05/01/20 0310          *Please note:  Carmita Boom was evaluated in Emergency Department on 05/01/2020 for the symptoms described in the history of present illness. She was evaluated in the context of the global COVID-19 pandemic, which necessitated consideration that the patient might be at risk for infection with the SARS-CoV-2 virus that causes COVID-19. Institutional protocols and algorithms that pertain to the evaluation of patients at risk for COVID-19 are in a state of rapid change based on information released by regulatory bodies including the CDC and federal and state organizations. These policies and algorithms were followed during the patient's care in the ED.  Some ED evaluations and interventions may be delayed as a result of limited staffing during and the pandemic.*   Note:  This document was prepared using Dragon voice recognition software and may include unintentional dictation errors.   Paulette Blanch, MD 05/01/20 0500

## 2020-05-10 ENCOUNTER — Encounter: Payer: Self-pay | Admitting: Obstetrics and Gynecology

## 2020-10-09 ENCOUNTER — Ambulatory Visit (INDEPENDENT_AMBULATORY_CARE_PROVIDER_SITE_OTHER): Payer: BC Managed Care – PPO | Admitting: Advanced Practice Midwife

## 2020-10-09 ENCOUNTER — Other Ambulatory Visit: Payer: Self-pay

## 2020-10-09 ENCOUNTER — Other Ambulatory Visit (HOSPITAL_COMMUNITY)
Admission: RE | Admit: 2020-10-09 | Discharge: 2020-10-09 | Disposition: A | Discharge status: Home / Self Care | Payer: BC Managed Care – PPO | Source: Ambulatory Visit | Attending: Advanced Practice Midwife | Admitting: Advanced Practice Midwife

## 2020-10-09 ENCOUNTER — Encounter: Payer: Self-pay | Admitting: Advanced Practice Midwife

## 2020-10-09 VITALS — BP 122/70 | Ht 62.0 in | Wt 196.4 lb

## 2020-10-09 DIAGNOSIS — N898 Other specified noninflammatory disorders of vagina: Secondary | ICD-10-CM | POA: Insufficient documentation

## 2020-10-09 DIAGNOSIS — N920 Excessive and frequent menstruation with regular cycle: Secondary | ICD-10-CM | POA: Diagnosis not present

## 2020-10-09 DIAGNOSIS — Z01419 Encounter for gynecological examination (general) (routine) without abnormal findings: Secondary | ICD-10-CM | POA: Diagnosis present

## 2020-10-09 DIAGNOSIS — N760 Acute vaginitis: Secondary | ICD-10-CM

## 2020-10-09 DIAGNOSIS — R5383 Other fatigue: Secondary | ICD-10-CM

## 2020-10-09 DIAGNOSIS — B9689 Other specified bacterial agents as the cause of diseases classified elsewhere: Secondary | ICD-10-CM

## 2020-10-10 ENCOUNTER — Other Ambulatory Visit: Payer: Self-pay | Admitting: Family Medicine

## 2020-10-10 ENCOUNTER — Encounter: Payer: Self-pay | Admitting: Advanced Practice Midwife

## 2020-10-10 DIAGNOSIS — R928 Other abnormal and inconclusive findings on diagnostic imaging of breast: Secondary | ICD-10-CM

## 2020-10-10 LAB — CERVICOVAGINAL ANCILLARY ONLY
Bacterial Vaginitis (gardnerella): POSITIVE — AB
Candida Glabrata: NEGATIVE
Candida Vaginitis: NEGATIVE
Comment: NEGATIVE
Comment: NEGATIVE
Comment: NEGATIVE

## 2020-10-10 LAB — CBC
Hematocrit: 37 % (ref 34.0–46.6)
Hemoglobin: 11.6 g/dL (ref 11.1–15.9)
MCH: 26 pg — ABNORMAL LOW (ref 26.6–33.0)
MCHC: 31.4 g/dL — ABNORMAL LOW (ref 31.5–35.7)
MCV: 83 fL (ref 79–97)
Platelets: 330 10*3/uL (ref 150–450)
RBC: 4.46 x10E6/uL (ref 3.77–5.28)
RDW: 14.6 % (ref 11.7–15.4)
WBC: 5.7 10*3/uL (ref 3.4–10.8)

## 2020-10-10 MED ORDER — METRONIDAZOLE 500 MG PO TABS: 500.0000 mg | ORAL_TABLET | Freq: Two times a day (BID) | ORAL | 0 refills | Status: AC

## 2020-10-10 NOTE — Progress Notes (Addendum)
Gynecology Annual Exam  Date of Service: 10/09/2020  PCP: Derinda Late, MD  Chief Complaint:  Chief Complaint  Patient presents with   Gynecologic Exam    History of Present Illness: Patient is a 50 y.o. [id number] presents for annual exam. The patient has complaint today of heavy and painful periods. Her periods occur every 4-6 weeks and last 5 days. 3 days are very heavy with "chunks/clots". She changes sanitary product every 2 hours and soils clothing, bedding and car. She is interested in treatment options. She does have a history of fibroids. She also mentions some vaginal discharge and fishy odor.  LMP: Patient's last menstrual period was 09/10/2020 (approximate).  Intermenstrual Bleeding: no Postcoital Bleeding: no Dysmenorrhea: yes   The patient is sexually active. She currently uses none for contraception. She denies dyspareunia.  The patient does perform self breast exams.  There is no notable family history of breast or ovarian cancer in her family.  The patient wears seatbelts: yes.   The patient has regular exercise: she has limited exercise currently and walks some at work, she admits generally healthy diet and hydration, she has interrupted sleep due to some hot flashes and getting up to pee during the night.    The patient denies current symptoms of depression.    Review of Systems: Review of Systems  Constitutional:  Positive for malaise/fatigue. Negative for chills and fever.  HENT:  Negative for congestion, ear discharge, ear pain, hearing loss, sinus pain and sore throat.   Eyes:  Negative for blurred vision and double vision.  Respiratory:  Negative for cough, shortness of breath and wheezing.   Cardiovascular:  Negative for chest pain, palpitations and leg swelling.  Gastrointestinal:  Negative for abdominal pain, blood in stool, constipation, diarrhea, heartburn, melena, nausea and vomiting.  Genitourinary:  Negative for dysuria, flank pain, frequency,  hematuria and urgency.       Positive for vaginal discharge with odor  Musculoskeletal:  Negative for back pain, joint pain and myalgias.  Skin:  Negative for itching and rash.  Neurological:  Negative for dizziness, tingling, tremors, sensory change, speech change, focal weakness, seizures, loss of consciousness, weakness and headaches.  Endo/Heme/Allergies:  Negative for environmental allergies. Does not bruise/bleed easily.       Positive for heavy/painful menstrual periods  Psychiatric/Behavioral:  Negative for depression, hallucinations, memory loss, substance abuse and suicidal ideas. The patient is not nervous/anxious and does not have insomnia.    Past Medical History:  Patient Active Problem List   Diagnosis Date Noted   Menorrhagia with regular cycle 08/10/2019   Obese 06/21/2015   TOXIC EFFECT OF FISH AND SHELLFISH 04/08/2010    Qualifier: Diagnosis of  By: Copland MD, Spencer      ASTHMA, EXERCISE INDUCED 04/02/2010    Qualifier: Diagnosis of  By: Diona Browner MD, Amy      Depression, major, recurrent, moderate ([place]) 10/26/2008    Qualifier: Diagnosis of  By: Ellin Mayhew CMA (AAMA), Heather      ALLERGIC RHINITIS DUE TO POLLEN 10/26/2008    Qualifier: Diagnosis of  By: Ellin Mayhew CMA (AAMA), Heather      ECZEMA 10/26/2008    Qualifier: Diagnosis of  By: Diona Browner MD, Amy      Chronic back pain 10/26/2008    Qualifier: Diagnosis of  By: Ellin Mayhew CMA (AAMA), Heather      SCOLIOSIS 10/26/2008    Qualifier: Diagnosis of  By: Ellin Mayhew CMA Deborra Medina), Heather       Past Surgical  History:  History reviewed. No pertinent surgical history.  Gynecologic History:  Patient's last menstrual period was 09/10/2020 (approximate). Contraception: none Last Pap: 2 years ago Results were:  no abnormalities  Last mammogram: 2021 Results were: BI-RAD III probably benign  Obstetric History: [id number]  Family History:  Family History  Problem Relation Age of Onset   Alzheimer's disease  Mother    Osteoporosis Mother    Hyperlipidemia Father    Hypertension Father    Cancer Father        liver   Diabetes Brother        type 2   Hyperlipidemia Brother    Hypertension Brother    Bipolar disorder Sister        with phsychotic episodes   Diabetes Sister        type 2   Kidney failure Other    Breast cancer Neg Hx     Social History:  Social History   Socioeconomic History   Marital status: Single    Spouse name: Not on file   Number of children: 2   Years of education: Not on file   Highest education level: Not on file  Occupational History   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[employer]  Tobacco Use   Smoking status: Never   Smokeless tobacco: Never  Vaping Use   Vaping Use: Never used  Substance and Sexual Activity   Alcohol use: Yes    Comment: socially   Drug use: No   Sexual activity: Yes    Birth control/protection: None  Other Topics Concern   Not on file  Social History Narrative   ** Merged History Encounter **       Regular exercise-yes, walking daily, goes to gym 3 times per week   Diet:fruit and veggies, love ice cream, drinks water 32 oz a day, limited calcium intake   Social Determinants of Health   Financial Resource Strain: Not on file  Food Insecurity: Not on file  Transportation Needs: Not on file  Physical Activity: Not on file  Stress: Not on file  Social Connections: Not on file  Intimate Partner Violence: Not on file    Allergies:  Allergies  Allergen Reactions   Fish-Derived Products Anaphylaxis   Peanut Oil Anaphylaxis and Itching    Medications: Prior to Admission medications   Medication Sig Start Date End Date Taking? Authorizing Provider  cetirizine (ZYRTEC) 10 MG tablet Take 10 mg by mouth daily.   Yes [provider]  clobetasol ointment (TEMOVATE) 0.05 % Apply topically 2 (two) times daily. 06/20/19  Yes [provider]  desonide (DESOWEN) 0.05 % ointment Apply 2 times  daily to the affected areas on the face. 05/08/16  Yes [provider]  [place] 300 MG/2ML SOPN Inject into the skin. 07/26/19  Yes [provider]  escitalopram (LEXAPRO) 10 MG tablet Take 10 mg by mouth daily. 06/18/19  Yes [provider]  loratadine (CLARITIN) 10 MG tablet Take 10 mg by mouth daily.   Yes [provider]  EPINEPHrine 0.3 mg/0.3 mL IJ SOAJ injection Inject into the muscle. 05/01/20   [provider]    Physical Exam Vitals: Blood pressure 122/70, height '5\' 2"'$  (1.575 m), weight 196 lb 6.4 oz (89.1 kg).  General: NAD HEENT: normocephalic, anicteric Thyroid: no enlargement, no palpable nodules Pulmonary: No increased work of breathing, CTAB Cardiovascular: RRR, distal pulses 2+ Breast: Breast symmetrical, no tenderness, no palpable nodules or masses, no  skin or nipple retraction present, no nipple discharge.  No axillary or supraclavicular lymphadenopathy. Abdomen: NABS, soft, non-tender, non-distended.  Umbilicus without lesions.  No hepatomegaly, splenomegaly or masses palpable. No evidence of hernia  Genitourinary:  External: Normal external female genitalia.  Normal urethral meatus, normal Bartholin's and Skene's glands.    Vagina: Normal vaginal mucosa, no evidence of prolapse, thin white discharge   Extremities: no edema, erythema, or tenderness Neurologic: Grossly intact Psychiatric: mood appropriate, affect full   Assessment: 50 y.o. [id number] routine annual exam  Plan: Problem List Items Addressed This Visit       Other   Menorrhagia with regular cycle   Relevant Orders   CBC (Completed)   Other Visit Diagnoses     Well woman exam with routine gynecological exam    -  Primary   Relevant Orders   CBC (Completed)   Cervicovaginal ancillary only (Completed)   Fatigue, unspecified type       Relevant Orders   CBC (Completed)   Vaginal discharge       Relevant Orders   Cervicovaginal ancillary only (Completed)    Vaginal odor       Relevant Orders   Cervicovaginal ancillary only (Completed)       1) Mammogram - recommend yearly screening mammogram.  Mammogram  ordered per PCP  2) STI screening  was offered and declined  3) ASCCP guidelines and rationale discussed.  Patient opts for every 3 years screening interval  4) Contraception - the patient is currently using  none.  She is happy with her current form of contraception and plans to continue  5) Colonoscopy -- Screening recommended starting at age 66 for average risk individuals, age 45 for individuals deemed at increased risk (including African Americans) and recommended to continue until age 50.  For patient age 60-85 individualized approach is recommended.  Gold standard screening is via colonoscopy, Cologuard screening is an acceptable alternative for patient unwilling or unable to undergo colonoscopy.  "Colorectal cancer screening for average?risk adults: 2018 guideline update from the [place]: A Cancer Journal for Clinicians: Jul 16, 2016. Patient plans on referral from PCP  6) Routine healthcare maintenance including cholesterol, diabetes screening discussed managed by PCP  7) Menorrhagia: CBC, return in about 1 week (around 10/16/2020) for MD visit to discuss/plan options to treat menorrhagia.   Rod Can, [facility] 10/10/2020, 1:02 PM

## 2020-10-26 ENCOUNTER — Ambulatory Visit
Admission: RE | Admit: 2020-10-26 | Discharge: 2020-10-26 | Disposition: A | Discharge status: Home / Self Care | Payer: BC Managed Care – PPO | Source: Ambulatory Visit | Attending: Family Medicine | Admitting: Family Medicine

## 2020-10-26 ENCOUNTER — Other Ambulatory Visit: Payer: Self-pay

## 2020-10-26 DIAGNOSIS — R928 Other abnormal and inconclusive findings on diagnostic imaging of breast: Secondary | ICD-10-CM | POA: Insufficient documentation

## 2020-11-02 ENCOUNTER — Other Ambulatory Visit: Payer: Self-pay

## 2020-11-02 ENCOUNTER — Encounter: Payer: Self-pay | Admitting: Obstetrics & Gynecology

## 2020-11-02 ENCOUNTER — Ambulatory Visit (INDEPENDENT_AMBULATORY_CARE_PROVIDER_SITE_OTHER): Payer: BC Managed Care – PPO | Admitting: Obstetrics & Gynecology

## 2020-11-02 VITALS — BP 120/70 | Ht 62.0 in | Wt 194.0 lb

## 2020-11-02 DIAGNOSIS — D219 Benign neoplasm of connective and other soft tissue, unspecified: Secondary | ICD-10-CM

## 2020-11-02 DIAGNOSIS — N920 Excessive and frequent menstruation with regular cycle: Secondary | ICD-10-CM

## 2020-11-02 NOTE — Progress Notes (Signed)
  Uterine Fibroids Patient is a G2P2 (plus 2 adopted) AAF who presents with known uterine fibroids but worsening symptoms realted to fibroids. Periods are regular every 28-30 days, lasting 5 days. She has severe bleedign for 3 of those days w clots, misses work and soils clothes, also has severe pain for about 3 of those days as well.  No intermenstrual bleeding, spotting, or discharge.  PMHx: She  has a past medical history of Allergic rhinitis due to pollen, Backache, unspecified, Depressive disorder, not elsewhere classified, and Scoliosis (and kyphoscoliosis), idiopathic. Also,  has no past surgical history on file., family history includes Alzheimer's disease in her mother; Bipolar disorder in her sister; Cancer in her father; Diabetes in her brother and sister; Hyperlipidemia in her brother and father; Hypertension in her brother and father; Kidney failure in an other family member; Osteoporosis in her mother.,  reports that she has never smoked. She has never used smokeless tobacco. She reports current alcohol use. She reports that she does not use drugs.  She has a current medication list which includes the following prescription(s): cetirizine, clobetasol ointment, desonide, dupixent, epinephrine, escitalopram, and loratadine. Also, is allergic to fish-derived products and peanut oil.  Review of Systems  Constitutional:  Positive for malaise/fatigue. Negative for chills and fever.  HENT:  Negative for congestion, sinus pain and sore throat.   Eyes:  Negative for blurred vision and pain.  Respiratory:  Negative for cough and wheezing.   Cardiovascular:  Negative for chest pain and leg swelling.  Gastrointestinal:  Positive for abdominal pain. Negative for constipation, diarrhea, heartburn, nausea and vomiting.  Genitourinary:  Negative for dysuria, frequency, hematuria and urgency.  Musculoskeletal:  Negative for back pain, joint pain, myalgias and neck pain.  Skin:  Negative for itching and  rash.  Neurological:  Negative for dizziness, tremors and weakness.  Endo/Heme/Allergies:  Does not bruise/bleed easily.  Psychiatric/Behavioral:  Negative for depression. The patient is not nervous/anxious and does not have insomnia.    Objective: BP 120/70   Ht '5\' 2"'$  (1.575 m)   Wt 194 lb (88 kg)   LMP 10/08/2020   BMI 35.48 kg/m  Physical Exam Constitutional:      General: She is not in acute distress.    Appearance: She is well-developed.  Musculoskeletal:        General: Normal range of motion.  Neurological:     Mental Status: She is alert and oriented to person, place, and time.  Skin:    General: Skin is warm and dry.  Vitals reviewed.    ASSESSMENT/PLAN:   Problem List Items Addressed This Visit       Other   Menorrhagia with regular cycle - Primary   Other Visit Diagnoses     Fibroids         Fibroid treatment such as [place], Lupron, Myomectomy, and Hysterectomy discussed in detail, with the pros and cons of each choice counseled.  No treatment as an option also discussed, as well as control of symptoms alone with hormone therapy. Information provided to the patient.  Info on [place] and Lap Hyst given today, after discussion of all options. Will decide and call.  A total of 25 minutes were spent face-to-face with the patient as well as preparation, review, communication, and documentation during this encounter.    Barnett Applebaum, MD, [facility], [facility] 11/02/2020  2:52 PM

## 2020-11-02 NOTE — Patient Instructions (Signed)
Total Laparoscopic Hysterectomy A total laparoscopic hysterectomy is a minimally invasive surgery to remove the uterus and cervix. The fallopian tubes and ovaries can also be removed during this surgery, if necessary. This procedure may be done to treat problems such as: Growths in the uterus (uterine fibroids) that are not cancer but cause symptoms. A condition that causes the lining of the uterus to grow in other areas (endometriosis). Problems with pelvic support. Cancer of the cervix, ovaries, uterus, or tissue that lines the uterus (endometrium). Excessive bleeding in the uterus. After this procedure, you will no longer be able to have a baby, and you will no longer have a menstrual period. Tell a health care provider about: Any allergies you have. All medicines you are taking, including vitamins, herbs, eye drops, creams, and over-the-counter medicines. Any problems you or family members have had with anesthetic medicines. Any blood disorders you have. Any surgeries you have had. Any medical conditions you have. Whether you are pregnant or may be pregnant. What are the risks? Generally, this is a safe procedure. However, problems may occur, including: Infection. Bleeding. Blood clots in the legs or lungs. Allergic reactions to medicines. Damage to nearby structures or organs. Having to change from this surgery to one in which a large incision is made in the abdomen (abdominal hysterectomy). What happens before the procedure? Staying hydrated Follow instructions from your health care provider about hydration, which may include: Up to 2 hours before the procedure - you may continue to drink clear liquids, such as water, clear fruit juice, black coffee, and plain tea.  Eating and drinking restrictions Follow instructions from your health care provider about eating and drinking, which may include: 8 hours before the procedure - stop eating heavy meals or foods, such as meat, fried  foods, or fatty foods. 6 hours before the procedure - stop eating light meals or foods, such as toast or cereal. 6 hours before the procedure - stop drinking milk or drinks that contain milk. 2 hours before the procedure - stop drinking clear liquids. Medicines Ask your health care provider about: Changing or stopping your regular medicines. This is especially important if you are taking diabetes medicines or blood thinners. Taking medicines such as aspirin and ibuprofen. These medicines can thin your blood. Do not take these medicines unless your health care provider tells you to take them. Taking over-the-counter medicines, vitamins, herbs, and supplements. You may be asked to take medicine that helps you have a bowel movement (laxative) to prevent constipation. General instructions If you were asked to do bowel preparation before the procedure, follow instructions from your health care provider. This procedure can affect the way you feel about yourself. Talk with your health care provider about the physical and emotional changes hysterectomy may cause. Do not use any products that contain nicotine or tobacco for at least 4 weeks before the procedure. These products include cigarettes, chewing tobacco, and vaping devices, such as e-cigarettes. If you need help quitting, ask your health care provider. Plan to have a responsible adult take you home from the hospital or clinic. Plan to have a responsible adult care for you for the time you are told after you leave the hospital or clinic. This is important. Surgery safety Ask your health care provider: How your surgery site will be marked. What steps will be taken to help prevent infection. These may include: Removing hair at the surgery site. Washing skin with a germ-killing soap. Receiving antibiotic medicine. What happens during the  procedure? An IV will be inserted into one of your veins. You will be given one or more of the following: A  medicine to help you relax (sedative). A medicine to make you fall asleep (general anesthetic). A medicine to numb the area (local anesthetic). A medicine that is injected into your spine to numb the area below and slightly above the injection site (spinal anesthetic). A medicine that is injected into an area of your body to numb everything below the injection site (regional anesthetic). A gas will be used to inflate your abdomen. This will allow your surgeon to look inside your abdomen and do the surgery. Three or four small incisions will be made in your abdomen. A small device with a light (laparoscope) will be inserted into one of your incisions. Surgical instruments will be inserted through the other incisions in order to perform the procedure. Your uterus and cervix may be removed through your vagina or cut into small pieces and removed through the small incisions. Any other organs that need to be removed will also be removed this way. The gas will be released from inside your abdomen. Your incisions will be closed with stitches (sutures), skin glue, or adhesive strips. A bandage (dressing) may be placed over your incisions. The procedure may vary among health care providers and hospitals. What happens after the procedure? Your blood pressure, heart rate, breathing rate, and blood oxygen level will be monitored until you leave the hospital or clinic. You will be given medicine for pain as needed. You will be encouraged to walk as soon as possible. You will also use a device to help you breathe or do breathing exercises to keep your lungs clear. You may have to wear compression stockings. These stockings help to prevent blood clots and reduce swelling in your legs. You will need to wear a sanitary pad for vaginal discharge or bleeding. Summary Total laparoscopic hysterectomy is a procedure to remove your uterus, cervix, and sometimes the fallopian tubes and ovaries. This procedure can  affect the way you feel about yourself. Talk with your health care provider about the physical and emotional changes hysterectomy may cause. After this procedure, you will no longer be able to have a baby, and you will no longer have a menstrual period. You will be given pain medicine to control discomfort after this procedure. Plan to have a responsible adult take you home from the hospital or clinic. This information is not intended to replace advice given to you by your health care provider. Make sure you discuss any questions you have with your health care provider. Document Revised: 10/07/2019 Document Reviewed: 10/07/2019 Elsevier Patient Education  [employer].   Uterine Artery Embolization for Fibroids Uterine artery embolization is a procedure to shrink uterine fibroids. Uterine fibroids are masses of tissue (tumors) that can develop in the womb (uterus). They are also called leiomyomas. This type of tumor is not cancerous (benign) and does not spread to other parts of the body outside of the pelvic area. The pelvic area is the part of the body between the hip bones. You can have one or many fibroids. Fibroids can vary in size, shape, weight, and where they grow in the uterus. Some can become quite large. In this procedure, a thin plastic tube (catheter) is used to inject a chemical that blocks off the blood supply to the fibroid, which causes the fibroid to shrink. Tell a health care provider about: Any allergies you have. All medicines you are  taking, including vitamins, herbs, eye drops, creams, and over-the-counter medicines. Any problems you or family members have had with anesthetic medicines. Any blood disorders you have. Any surgeries you have had. Any medical conditions you have. Whether you are pregnant or may be pregnant. What are the risks? Generally, this is a safe procedure. However, problems may occur, including: Bleeding. Allergic reactions to medicines or  dyes. Damage to other structures or organs. Infection, including blood infection (septicemia). Injury to the uterus from decreased blood supply. Lack of menstrual periods (amenorrhea). Death of tissue cells (necrosis) around your bladder or vulva. Development of a hole between organs or from an organ to the surface of your skin (fistula). Blood clot in the legs (deep vein thrombosis) or lung (pulmonary embolus). Nausea and vomiting. What happens before the procedure? Staying hydrated Follow instructions from your health care provider about hydration, which may include: Up to 2 hours before the procedure - you may continue to drink clear liquids, such as water, clear fruit juice, black coffee, and plain tea. Eating and drinking restrictions Follow instructions from your health care provider about eating and drinking, which may include: 8 hours before the procedure - stop eating heavy meals or foods such as meat, fried foods, or fatty foods. 6 hours before the procedure - stop eating light meals or foods, such as toast or cereal. 6 hours before the procedure - stop drinking milk or drinks that contain milk. 2 hours before the procedure - stop drinking clear liquids. Medicines Ask your health care provider about: Changing or stopping your regular medicines. This is especially important if you are taking diabetes medicines or blood thinners. Taking over-the-counter medicines, vitamins, herbs, and supplements. Taking medicines such as aspirin and ibuprofen. These medicines can thin your blood. Do not take these medicines unless your health care provider tells you to take them. You may be given antibiotic medicine to help prevent infection. You may be given medicine to prevent nausea and vomiting (antiemetic). General instructions Ask your health care provider how your surgical site will be marked or identified. You may be asked to shower with a germ-killing soap. Plan to have someone take you  home from the hospital or clinic. If you will be going home right after the procedure, plan to have someone with you for 24 hours. You will be asked to empty your bladder. What happens during the procedure? To lower your risk of infection: Your health care team will wash or sanitize their hands. Hair may be removed from the surgical area. Your skin will be washed with soap. An IV will be inserted into one of your veins. You will be given one or more of the following: A medicine to help you relax (sedative). A medicine to numb the area (local anesthetic). A small cut (incision) will be made in your groin. A catheter will be inserted into the main artery of your leg. The catheter will be guided through the artery to your uterus. A series of images will be taken while dye is injected through the catheter in your groin. X-rays are taken at the same time. This is done to provide a road map of the blood supply to your uterus and fibroids. Tiny plastic spheres, about the size of sand grains, will be injected through the catheter. Metal coils may be used to help block the artery. The particles will lodge in tiny branches of the uterine artery that supplies blood to the fibroids. The procedure will be repeated on the artery  that supplies the other side of the uterus. The catheter will be removed and pressure will be applied to stop the bleeding. A dressing will be placed over the incision. The procedure may vary among health care providers and hospitals. What happens after the procedure? Your blood pressure, heart rate, breathing rate, and blood oxygen level will be monitored until the medicines you were given have worn off. You will be given pain medicine as needed. You may be given medicine for nausea and vomiting as needed. Do not drive for 24 hours if you were given a sedative. Summary Uterine artery embolization is a procedure to shrink uterine fibroids by blocking the blood supply to the  fibroid. You may be given a sedative and local anesthetic for the procedure. A catheter will be inserted into the main artery of your leg. The catheter will be guided through the artery to your uterus. After the procedure you will be given pain medicine and medicine for nausea as needed. Do not drive for 24 hours if you were given a sedative. This information is not intended to replace advice given to you by your health care provider. Make sure you discuss any questions you have with your health care provider. Document Revised: 01/16/2017 Document Reviewed: 05/08/2016 Elsevier Patient Education  2021 [employer].

## 2020-12-23 ENCOUNTER — Telehealth: Payer: BC Managed Care – PPO | Admitting: Family

## 2020-12-23 NOTE — Progress Notes (Signed)
Pt attempting to do video visit for her son, but placed it under her name. She will submit a new visit under her name.   Morgan Dun, FNP

## 2021-08-05 ENCOUNTER — Encounter (INDEPENDENT_AMBULATORY_CARE_PROVIDER_SITE_OTHER): Payer: Self-pay

## 2021-09-08 ENCOUNTER — Encounter: Payer: BC Managed Care – PPO | Admitting: Nurse Practitioner

## 2021-09-08 NOTE — Progress Notes (Signed)
Appointment scheduled incorrectly. Was for her37 year old sone. Appointment scheduled and completed.

## 2021-10-09 NOTE — Progress Notes (Unsigned)
PCP:  Bo Merino, FNP   Chief Complaint  Patient presents with   Annual Exam     HPI:      Morgan Kemp is a 51 y.o. No obstetric history on file. who LMP was No LMP recorded. (Menstrual status: Perimenopausal)., presents today for her annual examination.  Her menses are regular every 4-6 wks, lasting 3-5 days, mod to heavy flow with clots, changing products every 2 hrs.  Dysmenorrhea moderate to severe, improved with tylenol. She does not have intermenstrual bleeding. GYN u/s 1/20 with multiple leio. Tried nuvaring for cycle control. It got "lost" inside but eventually she was able to remove it. Didn't do it anymore. Did OCPs in past without problems, had wt gain with depo, and had Mirena until 2016. Had monthly periods with Mirena that were normal flow.  She does have vasomotor sx.  Pt saw Dr. Kenton Kingfisher last yr and discussed [place], lupron, myomectomy, hyst. Pt would like to proceed with [place] now.   Sex activity: single partner, contraception - none.  Last Pap: 02/19/18  Results were: no abnormalities /neg HPV DNA. 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  Hx of STDs: HPV on pap  Last mammogram: 10/26/20 with PCP; stable LT breast asymmetry, repeat in 12 months.  There is no FH of breast cancer. There is no FH of ovarian cancer. The patient does occas do self-breast exams.  Tobacco use: The patient denies current or previous tobacco use. Alcohol use: none No drug use.  Exercise: min active  She does get adequate calcium but not Vitamin D in her diet.  Colonoscopy: never; would like GI ref  Labs with PCP   Past Medical History:  Diagnosis Date   Allergic rhinitis due to pollen    Backache, unspecified    Depressive disorder, not elsewhere classified    Scoliosis (and kyphoscoliosis), idiopathic     History reviewed. No pertinent surgical history.  Family History  Problem Relation Age of Onset   Alzheimer's disease Mother    Osteoporosis Mother     Hyperlipidemia Father    Hypertension Father    Cancer Father        liver   Diabetes Brother        type 2   Hyperlipidemia Brother    Hypertension Brother    Bipolar disorder Sister        with phsychotic episodes   Diabetes Sister        type 2   Kidney failure Other    Breast cancer Neg Hx     Social History   Socioeconomic History   Marital status: Single    Spouse name: Not on file   Number of children: 4   Years of education: Not on file   Highest education level: Not on file  Occupational History   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[employer] [place]  Tobacco Use   Smoking status: Never   Smokeless tobacco: Never  Vaping Use   Vaping Use: Never used  Substance and Sexual Activity   Alcohol use: Yes    Comment: socially   Drug use: No   Sexual activity: Yes    Birth control/protection: None  Other Topics Concern   Not on file  Social History Narrative   ** Merged History Encounter **       Regular exercise-yes, walking daily, goes to gym 3 times per week   Diet:fruit and veggies, love ice  cream, drinks water 32 oz a day, limited calcium intake   Social Determinants of Health   Financial Resource Strain: Not on file  Food Insecurity: Not on file  Transportation Needs: Not on file  Physical Activity: Not on file  Stress: Not on file  Social Connections: Not on file  Intimate Partner Violence: Not on file    Outpatient Medications Prior to Visit  Medication Sig Dispense Refill   cetirizine (ZYRTEC) 10 MG tablet Take 10 mg by mouth daily.     clobetasol ointment (TEMOVATE) 0.05 % Apply topically 2 (two) times daily.     desonide (DESOWEN) 0.05 % ointment Apply 2 times daily to the affected areas on the face.     DUPIXENT 300 MG/2ML SOPN Inject into the skin.     EPINEPHrine 0.3 mg/0.3 mL IJ SOAJ injection Inject into the muscle.     escitalopram (LEXAPRO) 10 MG tablet Take 10 mg by mouth daily.     loratadine (CLARITIN) 10 MG tablet Take  10 mg by mouth daily.     No facility-administered medications prior to visit.      ROS:  Review of Systems  Constitutional:  Negative for fatigue, fever and unexpected weight change.  Respiratory:  Negative for cough, shortness of breath and wheezing.   Cardiovascular:  Negative for chest pain, palpitations and leg swelling.  Gastrointestinal:  Negative for blood in stool, constipation, diarrhea, nausea and vomiting.  Endocrine: Negative for cold intolerance, heat intolerance and polyuria.  Genitourinary:  Positive for menstrual problem. Negative for dyspareunia, dysuria, flank pain, frequency, genital sores, hematuria, pelvic pain, urgency, vaginal bleeding, vaginal discharge and vaginal pain.  Musculoskeletal:  Negative for back pain, joint swelling and myalgias.  Skin:  Negative for rash.  Neurological:  Negative for dizziness, syncope, light-headedness, numbness and headaches.  Hematological:  Negative for adenopathy.  Psychiatric/Behavioral:  Negative for agitation, confusion, sleep disturbance and suicidal ideas. The patient is not nervous/anxious.   BREAST: No symptoms   Objective: BP 100/60   Ht '5\' 2"'$  (1.575 m)   Wt 197 lb 6.4 oz (89.5 kg)   BMI 36.10 kg/m    Physical Exam Constitutional:      Appearance: She is well-developed.  Genitourinary:     Vulva normal.     Right Labia: No rash, tenderness or lesions.    Left Labia: No tenderness, lesions or rash.    No vaginal discharge, erythema or tenderness.      Right Adnexa: not tender and no mass present.    Left Adnexa: not tender and no mass present.    No cervical friability or polyp.     Uterus is enlarged.     Uterus is not tender.  Breasts:    Right: No mass, nipple discharge, skin change or tenderness.     Left: No mass, nipple discharge, skin change or tenderness.  Neck:     Thyroid: No thyromegaly.  Cardiovascular:     Rate and Rhythm: Normal rate and regular rhythm.     Heart sounds: Normal heart  sounds. No murmur heard. Pulmonary:     Effort: Pulmonary effort is normal.     Breath sounds: Normal breath sounds.  Abdominal:     Palpations: Abdomen is soft.     Tenderness: There is no abdominal tenderness. There is no guarding or rebound.  Musculoskeletal:        General: Normal range of motion.     Cervical back: Normal range of motion.  Lymphadenopathy:  Cervical: No cervical adenopathy.  Neurological:     General: No focal deficit present.     Mental Status: She is alert and oriented to person, place, and time.     Cranial Nerves: No cranial nerve deficit.  Skin:    General: Skin is warm and dry.  Psychiatric:        Mood and Affect: Mood normal.        Behavior: Behavior normal.        Thought Content: Thought content normal.        Judgment: Judgment normal.  Vitals reviewed.    Results for orders placed or performed in visit on 10/10/21 (from the past 24 hour(s))  POCT hemoglobin     Status: None   Collection Time: 10/10/21 10:35 AM  Result Value Ref Range   Hemoglobin 12.2 11 - 14.6 g/dL    Assessment/Plan: Encounter for annual routine gynecological examination - Plan: POCT hemoglobin  Cervical cancer screening - Plan: Cytology - PAP  Screening for HPV (human papillomavirus) - Plan: Cytology - PAP  History of abnormal cervical Pap smear - Plan: Cytology - PAP; repeat pap today  Encounter for screening mammogram for malignant neoplasm of breast - Plan: MM 3D SCREEN BREAST BILATERAL; pt to schedule mammo  Screening for colon cancer - Plan: Ambulatory referral to Gastroenterology; refer to GI  Leiomyoma - Plan: Ambulatory referral to Vascular Surgery; discussed tx options. Pt would like [place], referral placed.   Menorrhagia with regular cycle - Plan: Ambulatory referral to Vascular Surgery   GYN counsel breast self exam, mammography screening, menopause, adequate intake of calcium and vitamin D, diet and exercise     F/U  Return in about 1 year  (around 10/11/2022).  Bronsen Serano B. Yanique Mulvihill, PA-C 10/10/2021 11:56 AM

## 2021-10-10 ENCOUNTER — Other Ambulatory Visit: Payer: Self-pay

## 2021-10-10 ENCOUNTER — Ambulatory Visit (INDEPENDENT_AMBULATORY_CARE_PROVIDER_SITE_OTHER): Payer: BC Managed Care – PPO | Admitting: Obstetrics and Gynecology

## 2021-10-10 ENCOUNTER — Encounter: Payer: Self-pay | Admitting: Obstetrics and Gynecology

## 2021-10-10 ENCOUNTER — Ambulatory Visit
Admission: RE | Admit: 2021-10-10 | Discharge: 2021-10-10 | Disposition: A | Discharge status: Home / Self Care | Payer: BC Managed Care – PPO | Source: Ambulatory Visit | Attending: Nurse Practitioner | Admitting: Nurse Practitioner

## 2021-10-10 ENCOUNTER — Encounter: Payer: Self-pay | Admitting: Nurse Practitioner

## 2021-10-10 ENCOUNTER — Ambulatory Visit: Payer: BC Managed Care – PPO | Admitting: Nurse Practitioner

## 2021-10-10 ENCOUNTER — Other Ambulatory Visit (HOSPITAL_COMMUNITY)
Admission: RE | Admit: 2021-10-10 | Discharge: 2021-10-10 | Disposition: A | Discharge status: Home / Self Care | Payer: BC Managed Care – PPO | Source: Ambulatory Visit | Attending: Obstetrics and Gynecology | Admitting: Obstetrics and Gynecology

## 2021-10-10 ENCOUNTER — Ambulatory Visit
Admission: RE | Admit: 2021-10-10 | Discharge: 2021-10-10 | Disposition: A | Discharge status: Home / Self Care | Payer: BC Managed Care – PPO | Attending: Nurse Practitioner | Admitting: Nurse Practitioner

## 2021-10-10 VITALS — BP 100/60 | Ht 62.0 in | Wt 197.4 lb

## 2021-10-10 VITALS — BP 120/72 | HR 70 | Temp 98.1°F | Resp 16 | Ht 62.0 in | Wt 197.2 lb

## 2021-10-10 DIAGNOSIS — Z1151 Encounter for screening for human papillomavirus (HPV): Secondary | ICD-10-CM | POA: Insufficient documentation

## 2021-10-10 DIAGNOSIS — G8929 Other chronic pain: Secondary | ICD-10-CM

## 2021-10-10 DIAGNOSIS — D219 Benign neoplasm of connective and other soft tissue, unspecified: Secondary | ICD-10-CM

## 2021-10-10 DIAGNOSIS — Z01411 Encounter for gynecological examination (general) (routine) with abnormal findings: Secondary | ICD-10-CM | POA: Diagnosis not present

## 2021-10-10 DIAGNOSIS — M5441 Lumbago with sciatica, right side: Secondary | ICD-10-CM | POA: Diagnosis not present

## 2021-10-10 DIAGNOSIS — Z01419 Encounter for gynecological examination (general) (routine) without abnormal findings: Secondary | ICD-10-CM

## 2021-10-10 DIAGNOSIS — Z131 Encounter for screening for diabetes mellitus: Secondary | ICD-10-CM

## 2021-10-10 DIAGNOSIS — Z124 Encounter for screening for malignant neoplasm of cervix: Secondary | ICD-10-CM | POA: Diagnosis present

## 2021-10-10 DIAGNOSIS — Z1231 Encounter for screening mammogram for malignant neoplasm of breast: Secondary | ICD-10-CM

## 2021-10-10 DIAGNOSIS — F331 Major depressive disorder, recurrent, moderate: Secondary | ICD-10-CM

## 2021-10-10 DIAGNOSIS — Z1211 Encounter for screening for malignant neoplasm of colon: Secondary | ICD-10-CM

## 2021-10-10 DIAGNOSIS — M4120 Other idiopathic scoliosis, site unspecified: Secondary | ICD-10-CM

## 2021-10-10 DIAGNOSIS — Z8742 Personal history of other diseases of the female genital tract: Secondary | ICD-10-CM

## 2021-10-10 DIAGNOSIS — F419 Anxiety disorder, unspecified: Secondary | ICD-10-CM

## 2021-10-10 DIAGNOSIS — E6609 Other obesity due to excess calories: Secondary | ICD-10-CM

## 2021-10-10 DIAGNOSIS — E785 Hyperlipidemia, unspecified: Secondary | ICD-10-CM

## 2021-10-10 DIAGNOSIS — Z6836 Body mass index (BMI) 36.0-36.9, adult: Secondary | ICD-10-CM

## 2021-10-10 DIAGNOSIS — N920 Excessive and frequent menstruation with regular cycle: Secondary | ICD-10-CM

## 2021-10-10 LAB — POCT HEMOGLOBIN: Hemoglobin: 12.2 g/dL (ref 11–14.6)

## 2021-10-10 MED ORDER — TIZANIDINE HCL 4 MG PO TABS: 4.0000 mg | ORAL_TABLET | Freq: Three times a day (TID) | ORAL | 2 refills | Status: DC | PRN

## 2021-10-10 NOTE — Assessment & Plan Note (Signed)
Obtain lumbar x-ray.  Continue taking ibuprofen for pain, can also take muscle relaxer as needed.  May use heat therapy and stretching.

## 2021-10-10 NOTE — Assessment & Plan Note (Signed)
Continue taking Lexapro 10 mg daily.

## 2021-10-10 NOTE — Patient Instructions (Signed)
I value your feedback and you entrusting us with your care. If you get a [facility] patient survey, I would appreciate you taking the time to let us know about your experience today. Thank you!  [facility]: [phone]      

## 2021-10-10 NOTE — Assessment & Plan Note (Signed)
Continue working on weight loss lifestyle modifications.  Getting labs today.  If appropriate will send in phentermine tomorrow.

## 2021-10-10 NOTE — Progress Notes (Signed)
BP 120/72   Pulse 70   Temp 98.1 F (36.7 C) (Oral)   Resp 16   Ht '5\' 2"'$  (1.575 m)   Wt 197 lb 3.2 oz (89.4 kg)   SpO2 98%   BMI 36.07 kg/m    Subjective:    Patient ID: Morgan Kemp, female    DOB: 01/16/1971, 51 y.o.   MRN: [id number]  HPI: Morgan Kemp is a 51 y.o. female  Chief Complaint  Patient presents with   Establish Care   Back Pain   Obesity   Establish care: Her last physical was last year.  Her last pap was today.  Her last mammogram was 10/26/2020, ordered by GYN. She is due for a colonoscopy ordered by GYN.     Back pain/scoliosis: Lower back pain radiating down the right leg for many years. She says she used to be in the [employer].  Stretching helps.She has taken ibuprofen and tylenol for pain.   She says she does have  history of scoliosis.  She denies any new injury.  She describes the pain as tightness.  She says sometimes it is a shooting pain. She denies any bowel or urine incontinence. Will obtain lumbar x-ray due to length of time of pain.  Patient is to continue taking ibuprofen for pain.  We will also send in muscle relaxer to use as needed.  Obesity: Her weight today is 197 lbs with a BMI of 36.07. She says that she is doing the weight watchers app to help with diet.  She says she has never been on weight loss medication before.  She states she has a family history of obesity.  Discussed weight loss medication options.  Patient would like to try phentermine.  We will get labs today.  If labs are normal we will send in phentermine tomorrow.  Discussed side effects and how to take medication with patient.  Hyperlipidemia: Her last LDL was 115 on 06/18/2015.  She is not currently on a cholesterol-lowering medication.  We will get labs today.  Depression/Anxiety: Patient reports her anxiety and depression is pretty well controlled with the Lexapro.  Patient states occasionally she does cry and lash out.  She thinks this might be due to menopause.  She is  currently taking lexapro 10 mg daily.      10/10/2021   10:09 AM 01/29/2017   10:02 AM  Depression screen PHQ 2/9  Decreased Interest 0 0  Down, Depressed, Hopeless 0 2  PHQ - 2 Score 0 2  Altered sleeping 0 2  Tired, decreased energy 0 1  Change in appetite 0 2  Feeling bad or failure about yourself  0 3  Trouble concentrating 0 1  Moving slowly or fidgety/restless 0 0  Suicidal thoughts 0 0  PHQ-9 Score 0 11  Difficult doing work/chores  Somewhat difficult       10/10/2021   10:09 AM  GAD 7 : Generalized Anxiety Score  Nervous, Anxious, on Edge 0  Control/stop worrying 0  Worry too much - different things 0  Trouble relaxing 0  Restless 0  Easily annoyed or irritable 0  Afraid - awful might happen 0  Total GAD 7 Score 0  Anxiety Difficulty Not difficult at all     Relevant past medical, surgical, family and social history reviewed and updated as indicated. Interim medical history since our last visit reviewed. Allergies and medications reviewed and updated.  Review of Systems  Constitutional: Negative for fever or  weight change.  Respiratory: Negative for cough and shortness of breath.   Cardiovascular: Negative for chest pain or palpitations.  Gastrointestinal: Negative for abdominal pain, no bowel changes.  Musculoskeletal: Negative for gait problem or joint swelling.  Positive for low back pain Skin: Negative for rash.  Neurological: Negative for dizziness or headache.  No other specific complaints in a complete review of systems (except as listed in HPI above).      Objective:    BP 120/72   Pulse 70   Temp 98.1 F (36.7 C) (Oral)   Resp 16   Ht '5\' 2"'$  (1.575 m)   Wt 197 lb 3.2 oz (89.4 kg)   SpO2 98%   BMI 36.07 kg/m   Wt Readings from Last 3 Encounters:  10/10/21 197 lb 3.2 oz (89.4 kg)  10/10/21 197 lb 6.4 oz (89.5 kg)  11/02/20 194 lb (88 kg)    Physical Exam  Constitutional: Patient appears well-developed and well-nourished. Obese  No  distress.  HEENT: head atraumatic, normocephalic, pupils equal and reactive to light,  neck supple Cardiovascular: Normal rate, regular rhythm and normal heart sounds.  No murmur heard. No BLE edema. Pulmonary/Chest: Effort normal and breath sounds normal. No respiratory distress. Abdominal: Soft.  There is no tenderness. Psychiatric: Patient has a normal mood and affect. behavior is normal. Judgment and thought content normal.  Results for orders placed or performed in visit on 10/10/21  POCT hemoglobin  Result Value Ref Range   Hemoglobin 12.2 11 - 14.6 g/dL      Assessment & Plan:   Problem List Items Addressed This Visit       Musculoskeletal and Integument   Idiopathic scoliosis and kyphoscoliosis    Obtain lumbar x-ray.  Continue taking ibuprofen for pain, can also take muscle relaxer as needed.  May use heat therapy and stretching.        Other   Depression, major, recurrent, moderate (HCC)    Continue taking Lexapro 10 mg daily.      Chronic back pain    Obtain lumbar x-ray.  Continue taking ibuprofen for pain, can also take muscle relaxer as needed.  May use heat therapy and stretching.      Relevant Medications   tiZANidine (ZANAFLEX) 4 MG tablet   Other Relevant Orders   DG Lumbar Spine Complete   Obese - Primary    Continue working on weight loss lifestyle modifications.  Getting labs today.  If appropriate will send in phentermine tomorrow.      Relevant Orders   CBC with Differential/Platelet   COMPLETE METABOLIC PANEL WITH GFR   Hemoglobin A1c   TSH   Anxiety    Continue taking Lexapro 10 mg daily.      Hyperlipidemia    Last LDL was checked 6 years ago.  We will get labs today.      Relevant Orders   Lipid panel   Other Visit Diagnoses     Screening for diabetes mellitus       Relevant Orders   Hemoglobin A1c        Follow up plan: Return in about 3 months (around 01/10/2022) for follow up.

## 2021-10-10 NOTE — Assessment & Plan Note (Signed)
Last LDL was checked 6 years ago.  We will get labs today.

## 2021-10-11 ENCOUNTER — Other Ambulatory Visit: Payer: Self-pay | Admitting: Nurse Practitioner

## 2021-10-11 DIAGNOSIS — E6609 Other obesity due to excess calories: Secondary | ICD-10-CM

## 2021-10-11 LAB — COMPLETE METABOLIC PANEL WITH GFR
AG Ratio: 1.5 (calc) (ref 1.0–2.5)
ALT: 10 U/L (ref 6–29)
AST: 17 U/L (ref 10–35)
Albumin: 4.1 g/dL (ref 3.6–5.1)
Alkaline phosphatase (APISO): 59 U/L (ref 37–153)
BUN/Creatinine Ratio: 8 (calc) (ref 6–22)
BUN: 6 mg/dL — ABNORMAL LOW (ref 7–25)
CO2: 25 mmol/L (ref 20–32)
Calcium: 9.3 mg/dL (ref 8.6–10.4)
Chloride: 106 mmol/L (ref 98–110)
Creat: 0.79 mg/dL (ref 0.50–1.03)
Globulin: 2.7 g/dL (calc) (ref 1.9–3.7)
Glucose, Bld: 72 mg/dL (ref 65–99)
Potassium: 4.3 mmol/L (ref 3.5–5.3)
Sodium: 140 mmol/L (ref 135–146)
Total Bilirubin: 0.3 mg/dL (ref 0.2–1.2)
Total Protein: 6.8 g/dL (ref 6.1–8.1)
eGFR: 91 mL/min/{1.73_m2} (ref >=60)

## 2021-10-11 LAB — CBC WITH DIFFERENTIAL/PLATELET
Absolute Monocytes: 373 cells/uL (ref 200–950)
Basophils Absolute: 29 cells/uL (ref 0–200)
Basophils Relative: 0.7 %
Eosinophils Absolute: 221 cells/uL (ref 15–500)
Eosinophils Relative: 5.4 %
HCT: 36.4 % (ref 35.0–45.0)
Hemoglobin: 11.5 g/dL — ABNORMAL LOW (ref 11.7–15.5)
Lymphs Abs: 2165 cells/uL (ref 850–3900)
MCH: 26.4 pg — ABNORMAL LOW (ref 27.0–33.0)
MCHC: 31.6 g/dL — ABNORMAL LOW (ref 32.0–36.0)
MCV: 83.7 fL (ref 80.0–100.0)
MPV: 11.4 fL (ref 7.5–12.5)
Monocytes Relative: 9.1 %
Neutro Abs: 1312 cells/uL — ABNORMAL LOW (ref 1500–7800)
Neutrophils Relative %: 32 %
Platelets: 266 10*3/uL (ref 140–400)
RBC: 4.35 10*6/uL (ref 3.80–5.10)
RDW: 15.3 % — ABNORMAL HIGH (ref 11.0–15.0)
Total Lymphocyte: 52.8 %
WBC: 4.1 10*3/uL (ref 3.8–10.8)

## 2021-10-11 LAB — HEMOGLOBIN A1C
Hgb A1c MFr Bld: 5.5 % of total Hgb (ref <5.7)
Mean Plasma Glucose: 111 mg/dL
eAG (mmol/L): 6.2 mmol/L

## 2021-10-11 LAB — LIPID PANEL
Cholesterol: 166 mg/dL (ref <200)
HDL: 46 mg/dL — ABNORMAL LOW (ref >=50)
LDL Cholesterol (Calc): 104 mg/dL (calc) — ABNORMAL HIGH
Non-HDL Cholesterol (Calc): 120 mg/dL (calc) (ref <130)
Total CHOL/HDL Ratio: 3.6 (calc) (ref <5.0)
Triglycerides: 75 mg/dL (ref <150)

## 2021-10-11 LAB — TSH: TSH: 0.5 mIU/L

## 2021-10-11 MED ORDER — PHENTERMINE HCL 37.5 MG PO TABS: 37.5000 mg | ORAL_TABLET | Freq: Every day | ORAL | 0 refills | Status: DC

## 2021-10-14 LAB — CYTOLOGY - PAP
Comment: NEGATIVE
Diagnosis: NEGATIVE
High risk HPV: NEGATIVE

## 2022-01-06 ENCOUNTER — Other Ambulatory Visit: Payer: Self-pay | Admitting: Nurse Practitioner

## 2022-01-06 DIAGNOSIS — G8929 Other chronic pain: Secondary | ICD-10-CM

## 2022-01-06 NOTE — Telephone Encounter (Signed)
Requested medication (s) are due for refill today - provider review   Requested medication (s) are on the active medication list -yes  Future visit scheduled -yes  Last refill: 10/10/21 #90 2RF  Notes to clinic: non delegated Rx  Requested Prescriptions  Pending Prescriptions Disp Refills   tiZANidine (ZANAFLEX) 4 MG tablet [Pharmacy Med Name: TIZANIDINE HCL 4 MG TABLET] 270 tablet     Sig: TAKE 1 TABLET BY MOUTH EVERY 8 HOURS AS NEEDED FOR MUSCLE SPASMS (MUSCLE TIGHTNESS).     Not Delegated - Cardiovascular:  Alpha-2 Agonists - tizanidine Failed - 01/06/2022  2:05 AM      Failed - This refill cannot be delegated      Passed - Valid encounter within last 6 months    Recent Outpatient Visits           2 months ago Class 2 obesity due to excess calories without serious comorbidity with body mass index (BMI) of 36.0 to 36.9 in adult   [place], FNP       Future Appointments             In 2 days Reece Packer, Myna Hidalgo, [facility], [facility]               Requested Prescriptions  Pending Prescriptions Disp Refills   tiZANidine (ZANAFLEX) 4 MG tablet [Pharmacy Med Name: TIZANIDINE HCL 4 MG TABLET] 270 tablet     Sig: TAKE 1 TABLET BY MOUTH EVERY 8 HOURS AS NEEDED FOR MUSCLE SPASMS (MUSCLE TIGHTNESS).     Not Delegated - Cardiovascular:  Alpha-2 Agonists - tizanidine Failed - 01/06/2022  2:05 AM      Failed - This refill cannot be delegated      Passed - Valid encounter within last 6 months    Recent Outpatient Visits           2 months ago Class 2 obesity due to excess calories without serious comorbidity with body mass index (BMI) of 36.0 to 36.9 in adult   [place], FNP       Future Appointments             In 2 days Reece Packer, Myna Hidalgo, [facility], [facility]

## 2022-01-07 NOTE — Progress Notes (Unsigned)
There were no vitals taken for this visit.   Subjective:    Patient ID: Morgan Kemp, female    DOB: [birth year], 51 y.o.   MRN: [id number]  HPI: Morgan Kemp is a 51 y.o. female  No chief complaint on file.  Obesity:  She was started on phentermine at last office visit.  Her weight at last office visit was 197 lbs.  Her weight today is *** with a BMI of ***.  Patient reports she has had *** side effects from the phentermine. Discussed lifestyle modification.   Relevant past medical, surgical, family and social history reviewed and updated as indicated. Interim medical history since our last visit reviewed. Allergies and medications reviewed and updated.  Review of Systems  Constitutional: Negative for fever or weight change.  Respiratory: Negative for cough and shortness of breath.   Cardiovascular: Negative for chest pain or palpitations.  Gastrointestinal: Negative for abdominal pain, no bowel changes.  Musculoskeletal: Negative for gait problem or joint swelling.  Skin: Negative for rash.  Neurological: Negative for dizziness or headache.  No other specific complaints in a complete review of systems (except as listed in HPI above).      Objective:    There were no vitals taken for this visit.  Wt Readings from Last 3 Encounters:  10/10/21 197 lb 3.2 oz (89.4 kg)  10/10/21 197 lb 6.4 oz (89.5 kg)  11/02/20 194 lb (88 kg)    Physical Exam  Constitutional: Patient appears well-developed and well-nourished. Obese *** No distress.  HEENT: head atraumatic, normocephalic, pupils equal and reactive to light, ears ***, neck supple, throat within normal limits Cardiovascular: Normal rate, regular rhythm and normal heart sounds.  No murmur heard. No BLE edema. Pulmonary/Chest: Effort normal and breath sounds normal. No respiratory distress. Abdominal: Soft.  There is no tenderness. Psychiatric: Patient has a normal mood and affect. behavior is normal. Judgment and  thought content normal.   Results for orders placed or performed in visit on 10/10/21  Lipid panel  Result Value Ref Range   Cholesterol 166 <200 mg/dL   HDL 46 (L) > OR = 50 mg/dL   Triglycerides 75 <150 mg/dL   LDL Cholesterol (Calc) 104 (H) mg/dL (calc)   Total CHOL/HDL Ratio 3.6 <5.0 (calc)   Non-HDL Cholesterol (Calc) 120 <130 mg/dL (calc)  CBC with Differential/Platelet  Result Value Ref Range   WBC 4.1 3.8 - 10.8 Thousand/uL   RBC 4.35 3.80 - 5.10 Million/uL   Hemoglobin 11.5 (L) 11.7 - 15.5 g/dL   HCT 36.4 35.0 - 45.0 %   MCV 83.7 80.0 - 100.0 fL   MCH 26.4 (L) 27.0 - 33.0 pg   MCHC 31.6 (L) 32.0 - 36.0 g/dL   RDW 15.3 (H) 11.0 - 15.0 %   Platelets 266 140 - 400 Thousand/uL   MPV 11.4 7.5 - 12.5 fL   Neutro Abs 1,312 (L) 1,500 - 7,800 cells/uL   Lymphs Abs 2,165 850 - 3,900 cells/uL   Absolute Monocytes 373 200 - 950 cells/uL   Eosinophils Absolute 221 15 - 500 cells/uL   Basophils Absolute 29 0 - 200 cells/uL   Neutrophils Relative % 32 %   Total Lymphocyte 52.8 %   Monocytes Relative 9.1 %   Eosinophils Relative 5.4 %   Basophils Relative 0.7 %  COMPLETE METABOLIC PANEL WITH GFR  Result Value Ref Range   Glucose, Bld 72 65 - 99 mg/dL   BUN 6 (L) 7 - 25 mg/dL  Creat 0.79 0.50 - 1.03 mg/dL   eGFR 91 > OR = 60 mL/min/1.73m2   BUN/Creatinine Ratio 8 6 - 22 (calc)   Sodium 140 135 - 146 mmol/L   Potassium 4.3 3.5 - 5.3 mmol/L   Chloride 106 98 - 110 mmol/L   CO2 25 20 - 32 mmol/L   Calcium 9.3 8.6 - 10.4 mg/dL   Total Protein 6.8 6.1 - 8.1 g/dL   Albumin 4.1 3.6 - 5.1 g/dL   Globulin 2.7 1.9 - 3.7 g/dL (calc)   AG Ratio 1.5 1.0 - 2.5 (calc)   Total Bilirubin 0.3 0.2 - 1.2 mg/dL   Alkaline phosphatase (APISO) 59 37 - 153 U/L   AST 17 10 - 35 U/L   ALT 10 6 - 29 U/L  Hemoglobin A1c  Result Value Ref Range   Hgb A1c MFr Bld 5.5 <5.7 % of total Hgb   Mean Plasma Glucose 111 mg/dL   eAG (mmol/L) 6.2 mmol/L  TSH  Result Value Ref Range   TSH 0.50 mIU/L       Assessment & Plan:   Problem List Items Addressed This Visit   None    Follow up plan: No follow-ups on file.      

## 2022-01-08 ENCOUNTER — Other Ambulatory Visit: Payer: Self-pay

## 2022-01-08 ENCOUNTER — Ambulatory Visit (INDEPENDENT_AMBULATORY_CARE_PROVIDER_SITE_OTHER): Payer: BC Managed Care – PPO | Admitting: Nurse Practitioner

## 2022-01-08 ENCOUNTER — Encounter: Payer: Self-pay | Admitting: Nurse Practitioner

## 2022-01-08 VITALS — BP 124/82 | HR 78 | Temp 98.6°F | Resp 16 | Ht 62.0 in | Wt 182.5 lb

## 2022-01-08 DIAGNOSIS — F331 Major depressive disorder, recurrent, moderate: Secondary | ICD-10-CM

## 2022-01-08 DIAGNOSIS — Z6836 Body mass index (BMI) 36.0-36.9, adult: Secondary | ICD-10-CM

## 2022-01-08 DIAGNOSIS — E6609 Other obesity due to excess calories: Secondary | ICD-10-CM | POA: Diagnosis not present

## 2022-01-08 DIAGNOSIS — F419 Anxiety disorder, unspecified: Secondary | ICD-10-CM | POA: Diagnosis not present

## 2022-01-08 MED ORDER — PHENTERMINE HCL 37.5 MG PO TABS: 37.5000 mg | ORAL_TABLET | Freq: Every day | ORAL | 0 refills | Status: DC

## 2022-01-08 MED ORDER — ESCITALOPRAM OXALATE 20 MG PO TABS: 20.0000 mg | ORAL_TABLET | Freq: Every day | ORAL | 1 refills | Status: DC

## 2022-01-08 NOTE — Assessment & Plan Note (Signed)
Increase lexapro to 20 mg daily.

## 2022-01-08 NOTE — Assessment & Plan Note (Signed)
Patient has been doing well on phentermine, getting labs today.  Hold for one month and may restart medication. Follow up in four months

## 2022-01-09 LAB — COMPLETE METABOLIC PANEL WITH GFR
AG Ratio: 1.4 (calc) (ref 1.0–2.5)
ALT: 16 U/L (ref 6–29)
AST: 16 U/L (ref 10–35)
Albumin: 4.1 g/dL (ref 3.6–5.1)
Alkaline phosphatase (APISO): 68 U/L (ref 37–153)
BUN/Creatinine Ratio: 6 (calc) (ref 6–22)
BUN: 4 mg/dL — ABNORMAL LOW (ref 7–25)
CO2: 28 mmol/L (ref 20–32)
Calcium: 9 mg/dL (ref 8.6–10.4)
Chloride: 104 mmol/L (ref 98–110)
Creat: 0.72 mg/dL (ref 0.50–1.03)
Globulin: 2.9 g/dL (calc) (ref 1.9–3.7)
Glucose, Bld: 78 mg/dL (ref 65–99)
Potassium: 3.9 mmol/L (ref 3.5–5.3)
Sodium: 139 mmol/L (ref 135–146)
Total Bilirubin: 0.4 mg/dL (ref 0.2–1.2)
Total Protein: 7 g/dL (ref 6.1–8.1)
eGFR: 102 mL/min/{1.73_m2} (ref >=60)

## 2022-01-21 ENCOUNTER — Other Ambulatory Visit: Payer: Self-pay | Admitting: Nurse Practitioner

## 2022-01-21 DIAGNOSIS — Z1231 Encounter for screening mammogram for malignant neoplasm of breast: Secondary | ICD-10-CM

## 2022-01-27 ENCOUNTER — Ambulatory Visit
Admission: RE | Admit: 2022-01-27 | Discharge: 2022-01-27 | Disposition: A | Discharge status: Home / Self Care | Payer: BC Managed Care – PPO | Source: Ambulatory Visit | Attending: Nurse Practitioner | Admitting: Nurse Practitioner

## 2022-01-27 DIAGNOSIS — Z1231 Encounter for screening mammogram for malignant neoplasm of breast: Secondary | ICD-10-CM | POA: Insufficient documentation

## 2022-02-01 ENCOUNTER — Other Ambulatory Visit: Payer: Self-pay | Admitting: Nurse Practitioner

## 2022-02-01 DIAGNOSIS — G8929 Other chronic pain: Secondary | ICD-10-CM

## 2022-02-03 NOTE — Telephone Encounter (Signed)
Requested medication (s) are due for refill today - no  Requested medication (s) are on the active medication list -no  Future visit scheduled -yes  Last refill: 10/10/21  Notes to clinic: non delegated Rx- no longer on current medication list  Requested Prescriptions  Pending Prescriptions Disp Refills   tiZANidine (ZANAFLEX) 4 MG tablet [Pharmacy Med Name: TIZANIDINE HCL 4 MG TABLET] 270 tablet     Sig: TAKE 1 TABLET BY MOUTH EVERY 8 HOURS AS NEEDED FOR MUSCLE SPASMS (MUSCLE TIGHTNESS).     Not Delegated - Cardiovascular:  Alpha-2 Agonists - tizanidine Failed - 02/01/2022  6:40 PM      Failed - This refill cannot be delegated      Passed - Valid encounter within last 6 months    Recent Outpatient Visits           3 weeks ago Class 2 obesity due to excess calories without serious comorbidity with body mass index (BMI) of 36.0 to 36.9 in adult   [facility] Serafina Royals F, FNP   3 months ago Class 2 obesity due to excess calories without serious comorbidity with body mass index (BMI) of 36.0 to 36.9 in adult   [place], FNP       Future Appointments             In 3 months Reece Packer, Myna Hidalgo, [facility], [facility]               Requested Prescriptions  Pending Prescriptions Disp Refills   tiZANidine (ZANAFLEX) 4 MG tablet [Pharmacy Med Name: TIZANIDINE HCL 4 MG TABLET] 270 tablet     Sig: TAKE 1 TABLET BY MOUTH EVERY 8 HOURS AS NEEDED FOR MUSCLE SPASMS (MUSCLE TIGHTNESS).     Not Delegated - Cardiovascular:  Alpha-2 Agonists - tizanidine Failed - 02/01/2022  6:40 PM      Failed - This refill cannot be delegated      Passed - Valid encounter within last 6 months    Recent Outpatient Visits           3 weeks ago Class 2 obesity due to excess calories without serious comorbidity with body mass index (BMI) of 36.0 to 36.9 in adult   [place], FNP   3  months ago Class 2 obesity due to excess calories without serious comorbidity with body mass index (BMI) of 36.0 to 36.9 in adult   [place], FNP       Future Appointments             In 3 months Reece Packer, Myna Hidalgo, [facility], [facility]

## 2022-05-08 NOTE — Progress Notes (Signed)
BP 112/62   Pulse 90   Temp 98.5 F (36.9 C)   Resp 18   Ht 5\' 2"  (1.575 m)   Wt 169 lb 14.4 oz (77.1 kg)   SpO2 98%   BMI 31.08 kg/m    Subjective:    Patient ID: Morgan Kemp, female    DOB: 05/16/1970, 52 y.o.   MRN: [id number]  HPI: Morgan Kemp is a 52 y.o. female  Chief Complaint  Patient presents with   Follow-up   Obesity:  She has been taking phentermine. Her weight at last office visit was 182 lbs.  her weight today is 169 lbs.  She reports she is doing well. Discussed that phentermine is a short term medication.  This will be her last refill and then we will need to discuss other options.   Depression/anxiety/insomnia: Last appointment we increased her lexapro to 20 mg daily.  She has been doing CBD gummies to help her sleep.  Patient reports she is doing well. She says she has had more insomnia.  She has had ambien in the past.  Will trial trazodone. .     05/09/2022    8:29 AM 01/08/2022    8:41 AM 10/10/2021   10:09 AM 01/29/2017   10:02 AM  Depression screen PHQ 2/9  Decreased Interest 0 0 0 0  Down, Depressed, Hopeless 0 0 0 2  PHQ - 2 Score 0 0 0 2  Altered sleeping 0 3 0 2  Tired, decreased energy 0 0 0 1  Change in appetite 0 0 0 2  Feeling bad or failure about yourself  0 0 0 3  Trouble concentrating 0 1 0 1  Moving slowly or fidgety/restless 0 0 0 0  Suicidal thoughts 0 0 0 0  PHQ-9 Score 0 4 0 11  Difficult doing work/chores Not difficult at all Not difficult at all  Somewhat difficult       01/08/2022    8:41 AM 10/10/2021   10:09 AM  GAD 7 : Generalized Anxiety Score  Nervous, Anxious, on Edge 0 0  Control/stop worrying 0 0  Worry too much - different things 1 0  Trouble relaxing 0 0  Restless 0 0  Easily annoyed or irritable 0 0  Afraid - awful might happen 0 0  Total GAD 7 Score 1 0  Anxiety Difficulty Not difficult at all Not difficult at all   Restless legs: she says she has been having restless legs at night making it  hard for her to sleep.  Will check labs and trial requip.   Relevant past medical, surgical, family and social history reviewed and updated as indicated. Interim medical history since our last visit reviewed. Allergies and medications reviewed and updated.  Review of Systems  Constitutional: Negative for fever or weight change.  Respiratory: Negative for cough and shortness of breath.   Cardiovascular: Negative for chest pain or palpitations.  Gastrointestinal: Negative for abdominal pain, no bowel changes.  Musculoskeletal: Negative for gait problem or joint swelling.  Skin: Negative for rash.  Neurological: Negative for dizziness or headache.  No other specific complaints in a complete review of systems (except as listed in HPI above).      Objective:    BP 112/62   Pulse 90   Temp 98.5 F (36.9 C)   Resp 18   Ht 5\' 2"  (1.575 m)   Wt 169 lb 14.4 oz (77.1 kg)   SpO2 98%   BMI  31.08 kg/m   Wt Readings from Last 3 Encounters:  05/09/22 169 lb 14.4 oz (77.1 kg)  01/08/22 182 lb 8 oz (82.8 kg)  10/10/21 197 lb 3.2 oz (89.4 kg)    Physical Exam  Constitutional: Patient appears well-developed and well-nourished. Obese  No distress.  HEENT: head atraumatic, normocephalic, pupils equal and reactive to light, neck supple Cardiovascular: Normal rate, regular rhythm and normal heart sounds.  No murmur heard. No BLE edema. Pulmonary/Chest: Effort normal and breath sounds normal. No respiratory distress. Abdominal: Soft.  There is no tenderness. Psychiatric: Patient has a normal mood and affect. behavior is normal. Judgment and thought content normal.   Results for orders placed or performed in visit on 01/08/22  COMPLETE METABOLIC PANEL WITH GFR  Result Value Ref Range   Glucose, Bld 78 65 - 99 mg/dL   BUN 4 (L) 7 - 25 mg/dL   Creat 0.72 0.50 - 1.03 mg/dL   eGFR 102 > OR = 60 mL/min/1.80m2   BUN/Creatinine Ratio 6 6 - 22 (calc)   Sodium 139 135 - 146 mmol/L   Potassium 3.9  3.5 - 5.3 mmol/L   Chloride 104 98 - 110 mmol/L   CO2 28 20 - 32 mmol/L   Calcium 9.0 8.6 - 10.4 mg/dL   Total Protein 7.0 6.1 - 8.1 g/dL   Albumin 4.1 3.6 - 5.1 g/dL   Globulin 2.9 1.9 - 3.7 g/dL (calc)   AG Ratio 1.4 1.0 - 2.5 (calc)   Total Bilirubin 0.4 0.2 - 1.2 mg/dL   Alkaline phosphatase (APISO) 68 37 - 153 U/L   AST 16 10 - 35 U/L   ALT 16 6 - 29 U/L      Assessment & Plan:   Problem List Items Addressed This Visit       Other   Depression, major, recurrent, moderate (HCC)    Doing well continue lexapro 20 mg daily.       Relevant Medications   traZODone (DESYREL) 50 MG tablet   Other insomnia    Start trazodone for insomnia      Relevant Medications   traZODone (DESYREL) 50 MG tablet   Obese    Patient has been on weight loss journey she is doing well.  Currently on phentermine, discussed this is a short term medication and this will be her last refill at current dose.        Relevant Medications   phentermine (ADIPEX-P) 37.5 MG tablet   Other Relevant Orders   COMPLETE METABOLIC PANEL WITH GFR   Anxiety    Doing well continue lexapro 20 mg daily.       Relevant Medications   traZODone (DESYREL) 50 MG tablet   Restless legs - Primary    Checking labs, start requip      Relevant Medications   rOPINIRole (REQUIP XL) 2 MG 24 hr tablet   Other Visit Diagnoses     Screening for deficiency anemia       Relevant Orders   CBC with Differential/Platelet   Iron, TIBC and Ferritin Panel   Colon cancer screening       Relevant Orders   Ambulatory referral to Gastroenterology         Follow up plan: Return in about 4 months (around 09/08/2022) for follow up.

## 2022-05-09 ENCOUNTER — Encounter: Payer: Self-pay | Admitting: Nurse Practitioner

## 2022-05-09 ENCOUNTER — Ambulatory Visit: Payer: BC Managed Care – PPO | Admitting: Nurse Practitioner

## 2022-05-09 VITALS — BP 112/62 | HR 90 | Temp 98.5°F | Resp 18 | Ht 62.0 in | Wt 169.9 lb

## 2022-05-09 DIAGNOSIS — Z6831 Body mass index (BMI) 31.0-31.9, adult: Secondary | ICD-10-CM

## 2022-05-09 DIAGNOSIS — E6609 Other obesity due to excess calories: Secondary | ICD-10-CM

## 2022-05-09 DIAGNOSIS — F331 Major depressive disorder, recurrent, moderate: Secondary | ICD-10-CM

## 2022-05-09 DIAGNOSIS — Z130 Encounter for screening for diseases of the blood and blood-forming organs and certain disorders involving the immune mechanism: Secondary | ICD-10-CM

## 2022-05-09 DIAGNOSIS — F419 Anxiety disorder, unspecified: Secondary | ICD-10-CM | POA: Diagnosis not present

## 2022-05-09 DIAGNOSIS — Z6836 Body mass index (BMI) 36.0-36.9, adult: Secondary | ICD-10-CM

## 2022-05-09 DIAGNOSIS — G4709 Other insomnia: Secondary | ICD-10-CM

## 2022-05-09 DIAGNOSIS — G2581 Restless legs syndrome: Secondary | ICD-10-CM | POA: Diagnosis not present

## 2022-05-09 DIAGNOSIS — Z1211 Encounter for screening for malignant neoplasm of colon: Secondary | ICD-10-CM

## 2022-05-09 MED ORDER — PHENTERMINE HCL 37.5 MG PO TABS: 37.5000 mg | ORAL_TABLET | Freq: Every day | ORAL | 0 refills | Status: DC

## 2022-05-09 MED ORDER — ROPINIROLE HCL ER 2 MG PO TB24: 2.0000 mg | ORAL_TABLET | Freq: Every day | ORAL | 1 refills | Status: DC

## 2022-05-09 MED ORDER — TRAZODONE HCL 50 MG PO TABS: 25.0000 mg | ORAL_TABLET | Freq: Every evening | ORAL | 1 refills | Status: DC | PRN

## 2022-05-09 NOTE — Assessment & Plan Note (Signed)
Start trazodone for insomnia

## 2022-05-09 NOTE — Assessment & Plan Note (Signed)
Checking labs, start requip

## 2022-05-09 NOTE — Assessment & Plan Note (Signed)
Patient has been on weight loss journey she is doing well.  Currently on phentermine, discussed this is a short term medication and this will be her last refill at current dose.

## 2022-05-09 NOTE — Assessment & Plan Note (Signed)
Doing well continue lexapro 20 mg daily.

## 2022-05-10 LAB — COMPLETE METABOLIC PANEL WITH GFR
AG Ratio: 1.5 (calc) (ref 1.0–2.5)
ALT: 17 U/L (ref 6–29)
AST: 20 U/L (ref 10–35)
Albumin: 4.4 g/dL (ref 3.6–5.1)
Alkaline phosphatase (APISO): 61 U/L (ref 37–153)
BUN/Creatinine Ratio: 8 (calc) (ref 6–22)
BUN: 6 mg/dL — ABNORMAL LOW (ref 7–25)
CO2: 30 mmol/L (ref 20–32)
Calcium: 9.7 mg/dL (ref 8.6–10.4)
Chloride: 103 mmol/L (ref 98–110)
Creat: 0.78 mg/dL (ref 0.50–1.03)
Globulin: 3 g/dL (calc) (ref 1.9–3.7)
Glucose, Bld: 79 mg/dL (ref 65–99)
Potassium: 4.2 mmol/L (ref 3.5–5.3)
Sodium: 142 mmol/L (ref 135–146)
Total Bilirubin: 0.4 mg/dL (ref 0.2–1.2)
Total Protein: 7.4 g/dL (ref 6.1–8.1)
eGFR: 92 mL/min/{1.73_m2} (ref >=60)

## 2022-05-10 LAB — CBC WITH DIFFERENTIAL/PLATELET
Absolute Monocytes: 503 cells/uL (ref 200–950)
Basophils Absolute: 42 cells/uL (ref 0–200)
Basophils Relative: 0.9 %
Eosinophils Absolute: 132 cells/uL (ref 15–500)
Eosinophils Relative: 2.8 %
HCT: 39.4 % (ref 35.0–45.0)
Hemoglobin: 12.5 g/dL (ref 11.7–15.5)
Lymphs Abs: 1701 cells/uL (ref 850–3900)
MCH: 27.4 pg (ref 27.0–33.0)
MCHC: 31.7 g/dL — ABNORMAL LOW (ref 32.0–36.0)
MCV: 86.2 fL (ref 80.0–100.0)
MPV: 11.9 fL (ref 7.5–12.5)
Monocytes Relative: 10.7 %
Neutro Abs: 2322 cells/uL (ref 1500–7800)
Neutrophils Relative %: 49.4 %
Platelets: 285 10*3/uL (ref 140–400)
RBC: 4.57 10*6/uL (ref 3.80–5.10)
RDW: 13.1 % (ref 11.0–15.0)
Total Lymphocyte: 36.2 %
WBC: 4.7 10*3/uL (ref 3.8–10.8)

## 2022-05-10 LAB — IRON,TIBC AND FERRITIN PANEL
%SAT: 15 % (calc) — ABNORMAL LOW (ref 16–45)
Ferritin: 10 ng/mL — ABNORMAL LOW (ref 16–232)
Iron: 51 ug/dL (ref 45–160)
TIBC: 349 mcg/dL (calc) (ref 250–450)

## 2022-05-12 ENCOUNTER — Encounter: Payer: Self-pay | Admitting: Nurse Practitioner

## 2022-05-15 ENCOUNTER — Telehealth: Payer: Self-pay

## 2022-05-15 DIAGNOSIS — Z1211 Encounter for screening for malignant neoplasm of colon: Secondary | ICD-10-CM

## 2022-05-15 NOTE — Telephone Encounter (Signed)
Patient calling to schedule colonoscopy.  

## 2022-05-19 ENCOUNTER — Other Ambulatory Visit: Payer: Self-pay

## 2022-05-19 DIAGNOSIS — Z1211 Encounter for screening for malignant neoplasm of colon: Secondary | ICD-10-CM

## 2022-05-19 MED ORDER — NA SULFATE-K SULFATE-MG SULF 17.5-3.13-1.6 GM/177ML PO SOLN: 1.0000 | Freq: Once | ORAL | 0 refills | Status: AC

## 2022-05-19 NOTE — Addendum Note (Signed)
Addended by: Vanetta Mulders on: 05/19/2022 09:33 AM   Modules accepted: Orders

## 2022-05-19 NOTE — Telephone Encounter (Signed)
Gastroenterology Pre-Procedure Review  Request Date: 06/13/22 Requesting Physician: Dr. Marius Ditch  PATIENT REVIEW QUESTIONS: The patient responded to the following health history questions as indicated:   Patient advised to stop Phentermine 1 day before colonoscopy. 1. Are you having any GI issues? no 2. Do you have a personal history of Polyps? no 3. Do you have a family history of Colon Cancer or Polyps? no 4. Diabetes Mellitus? no 5. Joint replacements in the past 12 months?no however patient had uterine fibroid surgery 12/2021 6. Major health problems in the past 3 months?no 7. Any artificial heart valves, MVP, or defibrillator?no    MEDICATIONS & ALLERGIES:    Patient reports the following regarding taking any anticoagulation/antiplatelet therapy:   Plavix, Coumadin, Eliquis, Xarelto, Lovenox, Pradaxa, Brilinta, or Effient? no Aspirin? no  Patient confirms/reports the following medications:  Current Outpatient Medications  Medication Sig Dispense Refill   cetirizine (ZYRTEC) 10 MG tablet Take 10 mg by mouth daily.     clobetasol ointment (TEMOVATE) 0.05 % Apply topically 2 (two) times daily.     desonide (DESOWEN) 0.05 % ointment Apply 2 times daily to the affected areas on the face.     DUPIXENT 300 MG/2ML SOPN Inject into the skin.     EPINEPHrine 0.3 mg/0.3 mL IJ SOAJ injection Inject into the muscle.     escitalopram (LEXAPRO) 20 MG tablet Take 1 tablet (20 mg total) by mouth daily. 90 tablet 1   loratadine (CLARITIN) 10 MG tablet Take 10 mg by mouth daily.     phentermine (ADIPEX-P) 37.5 MG tablet Take 1 tablet (37.5 mg total) by mouth daily before breakfast. 90 tablet 0   rOPINIRole (REQUIP XL) 2 MG 24 hr tablet Take 1 tablet (2 mg total) by mouth at bedtime. 90 tablet 1   traZODone (DESYREL) 50 MG tablet Take 0.5-1 tablets (25-50 mg total) by mouth at bedtime as needed for sleep. 90 tablet 1   No current facility-administered medications for this visit.    Patient  confirms/reports the following allergies:  Allergies  Allergen Reactions   Fish-Derived Products Anaphylaxis   Peanut Oil Anaphylaxis and Itching    Patient reports all peanut products    No orders of the defined types were placed in this encounter.   AUTHORIZATION INFORMATION Primary Insurance: 1D#: Group #:  Secondary Insurance: 1D#: Group #:  SCHEDULE INFORMATION: Date:  Time: Location:

## 2022-06-06 ENCOUNTER — Encounter: Payer: Self-pay | Admitting: Gastroenterology

## 2022-06-13 ENCOUNTER — Encounter: Payer: Self-pay | Admitting: Gastroenterology

## 2022-06-13 ENCOUNTER — Encounter
Admission: RE | Disposition: A | Discharge status: Home / Self Care | Payer: Self-pay | Source: Home / Self Care | Attending: Gastroenterology

## 2022-06-13 ENCOUNTER — Ambulatory Visit: Payer: BC Managed Care – PPO | Admitting: Anesthesiology

## 2022-06-13 ENCOUNTER — Ambulatory Visit
Admission: RE | Admit: 2022-06-13 | Discharge: 2022-06-13 | Disposition: A | Discharge status: Home / Self Care | Payer: BC Managed Care – PPO | Attending: Gastroenterology | Admitting: Gastroenterology

## 2022-06-13 DIAGNOSIS — K635 Polyp of colon: Secondary | ICD-10-CM | POA: Diagnosis not present

## 2022-06-13 DIAGNOSIS — Z1211 Encounter for screening for malignant neoplasm of colon: Secondary | ICD-10-CM | POA: Insufficient documentation

## 2022-06-13 DIAGNOSIS — G8929 Other chronic pain: Secondary | ICD-10-CM | POA: Diagnosis not present

## 2022-06-13 DIAGNOSIS — D122 Benign neoplasm of ascending colon: Secondary | ICD-10-CM | POA: Insufficient documentation

## 2022-06-13 DIAGNOSIS — M549 Dorsalgia, unspecified: Secondary | ICD-10-CM | POA: Insufficient documentation

## 2022-06-13 DIAGNOSIS — D120 Benign neoplasm of cecum: Secondary | ICD-10-CM | POA: Diagnosis not present

## 2022-06-13 DIAGNOSIS — J45909 Unspecified asthma, uncomplicated: Secondary | ICD-10-CM | POA: Diagnosis not present

## 2022-06-13 HISTORY — PX: COLONOSCOPY WITH PROPOFOL: SHX5780

## 2022-06-13 SURGERY — COLONOSCOPY WITH PROPOFOL
Anesthesia: General

## 2022-06-13 MED ORDER — LIDOCAINE HCL (CARDIAC) PF 100 MG/5ML IV SOSY
PREFILLED_SYRINGE | INTRAVENOUS | Status: DC | PRN
  Administered 2022-06-13: 50 mg via INTRAVENOUS

## 2022-06-13 MED ORDER — PROPOFOL 500 MG/50ML IV EMUL
INTRAVENOUS | Status: DC | PRN
  Administered 2022-06-13: 150 ug/kg/min via INTRAVENOUS

## 2022-06-13 MED ORDER — SODIUM CHLORIDE 0.9 % IV SOLN: INTRAVENOUS | Status: DC

## 2022-06-13 MED ORDER — PROPOFOL 10 MG/ML IV BOLUS
INTRAVENOUS | Status: DC | PRN
  Administered 2022-06-13: 80 mg via INTRAVENOUS

## 2022-06-13 NOTE — Anesthesia Preprocedure Evaluation (Addendum)
Anesthesia Evaluation  Patient identified by MRN, date of birth, ID band Patient awake    Reviewed: Allergy & Precautions, H&P , NPO status , Patient's Chart, lab work & pertinent test results  Airway Mallampati: II  TM Distance: >3 FB Neck ROM: full    Dental no notable dental hx.    Pulmonary asthma    Pulmonary exam normal        Cardiovascular negative cardio ROS Normal cardiovascular exam     Neuro/Psych  PSYCHIATRIC DISORDERS      Chronic back pain    GI/Hepatic negative GI ROS, Neg liver ROS,,,  Endo/Other  negative endocrine ROS    Renal/GU negative Renal ROS  negative genitourinary   Musculoskeletal   Abdominal Normal abdominal exam  (+)   Peds  Hematology negative hematology ROS (+)   Anesthesia Other Findings Past Medical History: No date: Allergic rhinitis due to pollen No date: Backache, unspecified No date: Depressive disorder, not elsewhere classified No date: Scoliosis (and kyphoscoliosis), idiopathic     Reproductive/Obstetrics negative OB ROS                             Anesthesia Physical Anesthesia Plan  ASA: 2  Anesthesia Plan: General   Post-op Pain Management:    Induction: Intravenous  PONV Risk Score and Plan: Propofol infusion and TIVA  Airway Management Planned: Natural Airway  Additional Equipment:   Intra-op Plan:   Post-operative Plan:   Informed Consent: I have reviewed the patients History and Physical, chart, labs and discussed the procedure including the risks, benefits and alternatives for the proposed anesthesia with the patient or authorized representative who has indicated his/her understanding and acceptance.     Dental Advisory Given  Plan Discussed with: CRNA and Surgeon  Anesthesia Plan Comments:         Anesthesia Quick Evaluation

## 2022-06-13 NOTE — Anesthesia Procedure Notes (Signed)
Date/Time: 06/13/2022 11:43 AM  Performed by: Ginger Carne, CRNAPre-anesthesia Checklist: Patient identified, Emergency Drugs available, Suction available, Patient being monitored and Timeout performed Patient Re-evaluated:Patient Re-evaluated prior to induction Oxygen Delivery Method: Nasal cannula Preoxygenation: Pre-oxygenation with 100% oxygen Induction Type: IV induction

## 2022-06-13 NOTE — Transfer of Care (Signed)
Immediate Anesthesia Transfer of Care Note  Patient: Morgan Kemp  Procedure(s) Performed: COLONOSCOPY WITH PROPOFOL  Patient Location: PACU  Anesthesia Type:General  Level of Consciousness: drowsy and patient cooperative  Airway & Oxygen Therapy: Patient Spontanous Breathing  Post-op Assessment: Report given to RN and Post -op Vital signs reviewed and stable  Post vital signs: Reviewed and stable  Last Vitals:  Vitals Value Taken Time  BP 99/68 06/13/22 1207  Temp    Pulse 68 06/13/22 1209  Resp 15 06/13/22 1209  SpO2 100 % 06/13/22 1209  Vitals shown include unvalidated device data.  Last Pain:  Vitals:   06/13/22 1207  TempSrc: (P) Temporal  PainSc:          Complications: No notable events documented.

## 2022-06-13 NOTE — H&P (Signed)
Morgan Repress, MD [address]  [address]  [place], [place] [address]  Main: [phone]  Fax: [phone] Pager: [phone]  Primary Care Physician:  Berniece Salines, FNP Primary Gastroenterologist:  Dr. Arlyss Kemp  Pre-Procedure History & Physical: HPI:  Morgan Kemp is a 52 y.o. female is here for an colonoscopy.   Past Medical History:  Diagnosis Date   Allergic rhinitis due to pollen    Backache, unspecified    Depressive disorder, not elsewhere classified    Scoliosis (and kyphoscoliosis), idiopathic     Past Surgical History:  Procedure Laterality Date   UTERINE FIBROID EMBOLIZATION      Prior to Admission medications   Medication Sig Start Date End Date Taking? Authorizing Provider  cetirizine (ZYRTEC) 10 MG tablet Take 10 mg by mouth daily.   Yes [provider]  DUPIXENT 300 MG/2ML SOPN Inject into the skin. 07/26/19  Yes [provider]  phentermine (ADIPEX-P) 37.5 MG tablet Take 1 tablet (37.5 mg total) by mouth daily before breakfast. 05/09/22  Yes Berniece Salines, FNP  rOPINIRole (REQUIP XL) 2 MG 24 hr tablet Take 1 tablet (2 mg total) by mouth at bedtime. 05/09/22  Yes Berniece Salines, FNP  clobetasol ointment (TEMOVATE) 0.05 % Apply topically 2 (two) times daily. 06/20/19   [provider]  desonide (DESOWEN) 0.05 % ointment Apply 2 times daily to the affected areas on the face. 05/08/16   [provider]  EPINEPHrine 0.3 mg/0.3 mL IJ SOAJ injection Inject into the muscle. 05/01/20   [provider]  escitalopram (LEXAPRO) 20 MG tablet Take 1 tablet (20 mg total) by mouth daily. 01/08/22   Berniece Salines, FNP  loratadine (CLARITIN) 10 MG tablet Take 10 mg by mouth daily.    [provider]  traZODone (DESYREL) 50 MG tablet Take 0.5-1 tablets (25-50 mg total) by mouth at bedtime as needed for sleep. 05/09/22   Berniece Salines, FNP    Allergies as of 05/19/2022 - Review Complete 05/19/2022   Allergen Reaction Noted   Fish-derived products Anaphylaxis 04/10/2016   Peanut oil Anaphylaxis and Itching 04/10/2016    Family History  Problem Relation Age of Onset   Alzheimer's disease Mother    Osteoporosis Mother    Hyperlipidemia Father    Hypertension Father    Cancer Father        liver   Diabetes Brother        type 2   Hyperlipidemia Brother    Hypertension Brother    Bipolar disorder Sister        with phsychotic episodes   Diabetes Sister        type 2   Kidney failure Other    Breast cancer Neg Hx     Social History   Socioeconomic History   Marital status: Single    Spouse name: Not on file   Number of children: 4   Years of education: Not on file   Highest education level: Not on file  Occupational History   Occupation: Hospital doctor: [employer]  Tobacco Use   Smoking status: Never   Smokeless tobacco: Never  Vaping Use   Vaping Use: Never used  Substance and Sexual Activity   Alcohol use: Yes    Comment: socially   Drug use: No   Sexual activity: Yes    Birth control/protection: None  Other Topics Concern   Not on file  Social History Narrative   ** Merged History Encounter **       Regular exercise-yes, walking daily, goes to gym 3 times per week   Diet:fruit and veggies, love ice cream, drinks water 32 oz a day, limited calcium intake   Social Determinants of Corporate investment banker Strain: Not on file  Food Insecurity: Not on file  Transportation Needs: Not on file  Physical Activity: Not on file  Stress: Not on file  Social Connections: Not on file  Intimate Partner Violence: Not on file    Review of Systems: See HPI, otherwise negative ROS  Physical Exam: BP 109/68   Pulse (!) 57   Temp (!) 96.3 F (35.7 C) (Temporal)   Resp 16   Ht 5\' 2"  (1.575 m)   Wt 75.3 kg   SpO2 100%   BMI 30.36 kg/m  General:   Alert,  pleasant and cooperative in NAD Head:  Normocephalic and  atraumatic. Neck:  Supple; no masses or thyromegaly. Lungs:  Clear throughout to auscultation.    Heart:  Regular rate and rhythm. Abdomen:  Soft, nontender and nondistended. Normal bowel sounds, without guarding, and without rebound.   Neurologic:  Alert and  oriented x4;  grossly normal neurologically.  Impression/Plan: [name] is here for an colonoscopy to be performed for colon cancer screening  Risks, benefits, limitations, and alternatives regarding  colonoscopy have been reviewed with the patient.  Questions have been answered.  All parties agreeable.   Morgan Donath, MD  06/13/2022, 11:27 AM

## 2022-06-13 NOTE — Op Note (Signed)
[facility] Patient Name: Morgan Kemp Procedure Date: 06/13/2022 11:26 AM MRN: [id number] Account #: [id number] Date of Birth: August 04, 1970 Admit Type: Outpatient Age: 52 Room: [facility] ENDO ROOM 2 Gender: Female Note Status: Finalized Instrument Name: Prentice Docker [id number] Procedure:             Colonoscopy Indications:           Screening for colorectal malignant neoplasm, This is                         the patient's first colonoscopy Providers:             Toney Reil MD, MD Referring MD:          Rudolpho Sevin. Zane Herald (Referring MD) Medicines:             General Anesthesia Complications:         No immediate complications. Estimated blood loss: None. Procedure:             Pre-Anesthesia Assessment:                        - Prior to the procedure, a History and Physical was                         performed, and patient medications and allergies were                         reviewed. The patient is competent. The risks and                         benefits of the procedure and the sedation options and                         risks were discussed with the patient. All questions                         were answered and informed consent was obtained.                         Patient identification and proposed procedure were                         verified by the physician, the nurse, the                         anesthesiologist, the anesthetist and the technician                         in the pre-procedure area in the procedure room in the                         endoscopy suite. Mental Status Examination: alert and                         oriented. Airway Examination: normal oropharyngeal                         airway and neck mobility. Respiratory Examination:  clear to auscultation. CV Examination: normal.                         Prophylactic Antibiotics: The patient does not require                         prophylactic  antibiotics. Prior Anticoagulants: The                         patient has taken no anticoagulant or antiplatelet                         agents. ASA Grade Assessment: II - A patient with mild                         systemic disease. After reviewing the risks and                         benefits, the patient was deemed in satisfactory                         condition to undergo the procedure. The anesthesia                         plan was to use general anesthesia. Immediately prior                         to administration of medications, the patient was                         re-assessed for adequacy to receive sedatives. The                         heart rate, respiratory rate, oxygen saturations,                         blood pressure, adequacy of pulmonary ventilation, and                         response to care were monitored throughout the                         procedure. The physical status of the patient was                         re-assessed after the procedure.                        After obtaining informed consent, the colonoscope was                         passed under direct vision. Throughout the procedure,                         the patient's blood pressure, pulse, and oxygen                         saturations were monitored continuously. The  Colonoscope was introduced through the anus and                         advanced to the the cecum, identified by appendiceal                         orifice and ileocecal valve. The colonoscopy was                         performed without difficulty. The patient tolerated                         the procedure well. The quality of the bowel                         preparation was evaluated using the BBPS [facility] Bowel                         Preparation Scale) with scores of: Right Colon = 3,                         Transverse Colon = 3 and Left Colon = 3 (entire mucosa                         seen  well with no residual staining, small fragments                         of stool or opaque liquid). The total BBPS score                         equals 9. The ileocecal valve, appendiceal orifice,                         and rectum were photographed. Findings:      The perianal and digital rectal examinations were normal. Pertinent       negatives include normal sphincter tone and no palpable rectal lesions.      Two sessile polyps were found in the ascending colon and cecum. The       polyps were 4 to 5 mm in size. These polyps were removed with a cold       snare. Resection and retrieval were complete. Estimated blood loss: none.      The exam was otherwise without abnormality. Impression:            - Two 4 to 5 mm polyps in the ascending colon and in                         the cecum, removed with a cold snare. Resected and                         retrieved.                        - The examination was otherwise normal. Recommendation:        - Discharge patient to home (with escort).                        -  Resume previous diet today.                        - Continue present medications.                        - Await pathology results.                        - Repeat colonoscopy in 5 years for surveillance. Procedure Code(s):     --- Professional ---                        [phone], Colonoscopy, flexible; with removal of                         tumor(s), polyp(s), or other lesion(s) by snare                         technique Diagnosis Code(s):     --- Professional ---                        Z12.11, Encounter for screening for malignant neoplasm                         of colon                        D12.2, Benign neoplasm of ascending colon                        D12.0, Benign neoplasm of cecum CPT copyright 2022 American Medical Association. All rights reserved. The codes documented in this report are preliminary and upon coder review may  be revised to meet current compliance  requirements. Dr. Libby Maw Toney Reil MD, MD 06/13/2022 12:04:03 PM This report has been signed electronically. Number of Addenda: 0 Note Initiated On: 06/13/2022 11:26 AM Scope Withdrawal Time: 0 hours 12 minutes 0 seconds  Total Procedure Duration: 0 hours 15 minutes 20 seconds  Estimated Blood Loss:  Estimated blood loss: none.      [facility]

## 2022-06-16 ENCOUNTER — Encounter: Payer: Self-pay | Admitting: Gastroenterology

## 2022-06-16 LAB — SURGICAL PATHOLOGY

## 2022-06-16 NOTE — Anesthesia Postprocedure Evaluation (Signed)
Anesthesia Post Note  Patient: Morgan Kemp  Procedure(s) Performed: COLONOSCOPY WITH PROPOFOL  Patient location during evaluation: PACU Anesthesia Type: General Level of consciousness: awake and alert Pain management: pain level controlled Vital Signs Assessment: post-procedure vital signs reviewed and stable Respiratory status: spontaneous breathing, nonlabored ventilation and respiratory function stable Cardiovascular status: blood pressure returned to baseline and stable Postop Assessment: no apparent nausea or vomiting Anesthetic complications: no   No notable events documented.   Last Vitals:  Vitals:   06/13/22 1207 06/13/22 1233  BP: 99/68 (!) 103/57  Pulse: 70 63  Resp: 17   Temp: (!) 35.6 C   SpO2: 100% 100%    Last Pain:  Vitals:   06/15/22 1039  TempSrc:   PainSc: 0-No pain                 Foye Deer

## 2022-06-17 ENCOUNTER — Encounter: Payer: Self-pay | Admitting: Gastroenterology

## 2022-09-05 ENCOUNTER — Ambulatory Visit
Admission: RE | Admit: 2022-09-05 | Discharge: 2022-09-05 | Disposition: A | Discharge status: Home / Self Care | Payer: BC Managed Care – PPO | Source: Ambulatory Visit | Attending: Nurse Practitioner | Admitting: Nurse Practitioner

## 2022-09-05 ENCOUNTER — Other Ambulatory Visit: Payer: Self-pay

## 2022-09-05 ENCOUNTER — Ambulatory Visit (INDEPENDENT_AMBULATORY_CARE_PROVIDER_SITE_OTHER): Payer: BC Managed Care – PPO | Admitting: Nurse Practitioner

## 2022-09-05 ENCOUNTER — Ambulatory Visit
Admission: RE | Admit: 2022-09-05 | Discharge: 2022-09-05 | Disposition: A | Discharge status: Home / Self Care | Payer: BC Managed Care – PPO | Attending: Nurse Practitioner | Admitting: Nurse Practitioner

## 2022-09-05 VITALS — BP 122/74 | HR 84 | Temp 98.3°F | Resp 16 | Ht 62.0 in | Wt 173.7 lb

## 2022-09-05 DIAGNOSIS — G8929 Other chronic pain: Secondary | ICD-10-CM

## 2022-09-05 DIAGNOSIS — G4709 Other insomnia: Secondary | ICD-10-CM

## 2022-09-05 DIAGNOSIS — M25551 Pain in right hip: Secondary | ICD-10-CM | POA: Insufficient documentation

## 2022-09-05 DIAGNOSIS — F419 Anxiety disorder, unspecified: Secondary | ICD-10-CM | POA: Diagnosis not present

## 2022-09-05 DIAGNOSIS — M25561 Pain in right knee: Secondary | ICD-10-CM | POA: Insufficient documentation

## 2022-09-05 DIAGNOSIS — G2581 Restless legs syndrome: Secondary | ICD-10-CM

## 2022-09-05 DIAGNOSIS — E785 Hyperlipidemia, unspecified: Secondary | ICD-10-CM

## 2022-09-05 DIAGNOSIS — Z6831 Body mass index (BMI) 31.0-31.9, adult: Secondary | ICD-10-CM

## 2022-09-05 DIAGNOSIS — E6609 Other obesity due to excess calories: Secondary | ICD-10-CM | POA: Diagnosis not present

## 2022-09-05 DIAGNOSIS — F331 Major depressive disorder, recurrent, moderate: Secondary | ICD-10-CM

## 2022-09-05 DIAGNOSIS — E611 Iron deficiency: Secondary | ICD-10-CM

## 2022-09-05 MED ORDER — QSYMIA 3.75-23 MG PO CP24
1.0000 | ORAL_CAPSULE | Freq: Every day | ORAL | 0 refills | Status: DC
Start: 2022-09-05 — End: 2022-10-06

## 2022-09-05 MED ORDER — BUPROPION HCL ER (XL) 150 MG PO TB24
150.0000 mg | ORAL_TABLET | Freq: Every day | ORAL | 0 refills | Status: DC
Start: 2022-09-05 — End: 2022-10-01

## 2022-09-05 NOTE — Assessment & Plan Note (Signed)
Stop phentermine, start qsymia, continue to work on life style modification

## 2022-09-05 NOTE — Assessment & Plan Note (Signed)
Continue working on lifestyle modification.  

## 2022-09-05 NOTE — Assessment & Plan Note (Signed)
Continue taking iron supplement, use requip as needed

## 2022-09-05 NOTE — Assessment & Plan Note (Signed)
Weaning off lexapro, start wellbutrin, follow up in 4 weeks

## 2022-09-05 NOTE — Progress Notes (Signed)
BP 122/74   Pulse 84   Temp 98.3 F (36.8 C) (Oral)   Resp 16   Ht 5\' 2"  (1.575 m)   Wt 173 lb 11.2 oz (78.8 kg)   SpO2 99%   BMI 31.77 kg/m    Subjective:    Patient ID: Morgan Kemp, female    DOB: 18-Aug-1970, 52 y.o.   MRN: [id number]  HPI: Morgan Kemp is a 52 y.o. female  Chief Complaint  Patient presents with   Obesity   Depression   Anxiety    4 month recheck   Obesity:  Current weight : 173 lbs BMI: 31.77 Previous weight:169 Treatment Tried: phentermine, life style modification, will switch to qsymia, she is exercising  Comorbidities: depression, anxiety   Depression/anxiety/insomnia Medication lexapro 20 mg daily, trazodone for sleep Compliant yes Side effects none PHQ9 negative GAD positive,  she says her anxiety has gotten worse,  she says that she is having a lot of anxiety and does not feel like the lexapro is work.   Previous meds:  zoloft, effexor and lexapro, wellburtrin She would like to try wellbutrin again. Will send in prescription.  Start weaning off lexapro. Take 1/2 tablet daily for a week. Then can discontinue lexapro     09/05/2022    1:50 PM 05/09/2022    8:29 AM 01/08/2022    8:41 AM 10/10/2021   10:09 AM 01/29/2017   10:02 AM  Depression screen PHQ 2/9  Decreased Interest 0 0 0 0 0  Down, Depressed, Hopeless 0 0 0 0 2  PHQ - 2 Score 0 0 0 0 2  Altered sleeping 1 0 3 0 2  Tired, decreased energy 0 0 0 0 1  Change in appetite 0 0 0 0 2  Feeling bad or failure about yourself  0 0 0 0 3  Trouble concentrating 1 0 1 0 1  Moving slowly or fidgety/restless 0 0 0 0 0  Suicidal thoughts 0 0 0 0 0  PHQ-9 Score 2 0 4 0 11  Difficult doing work/chores Not difficult at all Not difficult at all Not difficult at all  Somewhat difficult       09/05/2022    1:51 PM 01/08/2022    8:41 AM 10/10/2021   10:09 AM  GAD 7 : Generalized Anxiety Score  Nervous, Anxious, on Edge 3 0 0  Control/stop worrying 3 0 0  Worry too much -  different things 3 1 0  Trouble relaxing 1 0 0  Restless 0 0 0  Easily annoyed or irritable 1 0 0  Afraid - awful might happen 0 0 0  Total GAD 7 Score 11 1 0  Anxiety Difficulty Somewhat difficult Not difficult at all Not difficult at all   Restless legs/iron deficiency: she says she has been having restless legs at night making it hard for her to sleep.  Trialed requip. Got lab work done which showed low iron.  Patient reports that her restless legs have been better since taking iron supplement.  Will recheck labs at next visit Iron/TIBC/Ferritin/ %Sat    Component Value Date/Time   IRON 51 05/09/2022 0921   TIBC 349 05/09/2022 0921   FERRITIN 10 (L) 05/09/2022 0921   IRONPCTSAT 15 (L) 05/09/2022 0921    Right knee/right hip pain: she reports she has had right hip and knee pain for many years.  She denies any injury.  She denies any She would like to get an xray. Will  get xray and may  refer to orthopedics.    HLD:  -Medications: not on medication, working on lifestyle modificaiton -Last lipid panel:  Lipid Panel     Component Value Date/Time   CHOL 166 10/10/2021 1049   TRIG 75 10/10/2021 1049   HDL 46 (L) 10/10/2021 1049   CHOLHDL 3.6 10/10/2021 1049   VLDL 12.0 06/18/2015 1012   LDLCALC 104 (H) 10/10/2021 1049    The 10-year ASCVD risk score (Arnett DK, et al., 2019) is: 1.7%   Values used to calculate the score:     Age: 22 years     Sex: Female     Is Non-Hispanic African American: Yes     Diabetic: No     Tobacco smoker: No     Systolic Blood Pressure: 122 mmHg     Is BP treated: No     HDL Cholesterol: 46 mg/dL     Total Cholesterol: 166 mg/dL  Relevant past medical, surgical, family and social history reviewed and updated as indicated. Interim medical history since our last visit reviewed. Allergies and medications reviewed and updated.  Review of Systems  Constitutional: Negative for fever or weight change.  Respiratory: Negative for cough and shortness of  breath.   Cardiovascular: Negative for chest pain or palpitations.  Gastrointestinal: Negative for abdominal pain, no bowel changes.  Musculoskeletal: Negative for gait problem or joint swelling. Positive right hip and right knee pain Skin: Negative for rash.  Neurological: Negative for dizziness or headache.  No other specific complaints in a complete review of systems (except as listed in HPI above).      Objective:    BP 122/74   Pulse 84   Temp 98.3 F (36.8 C) (Oral)   Resp 16   Ht 5\' 2"  (1.575 m)   Wt 173 lb 11.2 oz (78.8 kg)   SpO2 99%   BMI 31.77 kg/m   Wt Readings from Last 3 Encounters:  09/05/22 173 lb 11.2 oz (78.8 kg)  06/13/22 166 lb (75.3 kg)  05/09/22 169 lb 14.4 oz (77.1 kg)    Physical Exam  Constitutional: Patient appears well-developed and well-nourished. Obese  No distress.  HEENT: head atraumatic, normocephalic, pupils equal and reactive to light, neck supple Cardiovascular: Normal rate, regular rhythm and normal heart sounds.  No murmur heard. No BLE edema. Pulmonary/Chest: Effort normal and breath sounds normal. No respiratory distress. Abdominal: Soft.  There is no tenderness. Psychiatric: Patient has a normal mood and affect. behavior is normal. Judgment and thought content normal.   Results for orders placed or performed during the hospital encounter of 06/13/22  Surgical pathology  Result Value Ref Range   SURGICAL PATHOLOGY      SURGICAL PATHOLOGY CASE: [id number] PATIENT: Avie Echevaria Surgical Pathology Report     Specimen Submitted: A. Colon polyp, cecum; cold snare B. Colon polyp, ascending; cold snare  Clinical History: Screening colonoscopy.  Polyps      DIAGNOSIS: A.  COLON POLYP, CECUM; COLD SNARE: - TUBULAR ADENOMA. - NEGATIVE FOR HIGH-GRADE DYSPLASIA AND MALIGNANCY.  B.  COLON POLYP, ASCENDING; COLD SNARE: - TUBULAR ADENOMA. - NEGATIVE FOR HIGH-GRADE DYSPLASIA AND MALIGNANCY.  GROSS DESCRIPTION: A. Labeled:  Cold snare cecal polyp Received: Formalin Collection time: 11:51 AM on 06/13/2022 Placed into formalin time: 11:51 AM on 06/13/2022 Tissue fragment(s): Multiple Size: Aggregate, 1.2 x 0.3 x 0.1 cm Description: Tan-pink soft tissue fragments Entirely submitted in 1 cassette.  B. Labeled: Cold snare ascending colon polyp Received: Formalin Collection  time: 11:55 AM on 06/13/2022 Placed into formalin time: 11:55 AM on 06/13/2022 Tissue fragment(s): Multiple S ize: Aggregate, 0.5 x 0.5 x 0.1 cm Description: Tan soft tissue fragments Entirely submitted in 1 cassette.  CM 06/13/2022  Final Diagnosis performed by Elijah Birk, MD.   Electronically signed 06/16/2022 10:24:28AM The electronic signature indicates that the named Attending Pathologist has evaluated the specimen Technical component performed at [facility], [address] [place], [place] [address] Lab: [phone] Dir: Jolene Schimke, MD, MMM  Professional component performed at [facility], [facility], [address] [place], [place], [place] [address] Lab: [phone] Dir: Beryle Quant, MD       Assessment & Plan:   Problem List Items Addressed This Visit       Other   Depression, major, recurrent, moderate (HCC)    Wean off lexapro, start wellbutrin      Relevant Medications   buPROPion (WELLBUTRIN XL) 150 MG 24 hr tablet   Other insomnia    Trazodone as needed for sleep      Obese    Stop phentermine, start qsymia, continue to work on life style modification      Relevant Medications   Phentermine-Topiramate (QSYMIA) 3.75-23 MG CP24   Anxiety    Weaning off lexapro, start wellbutrin, follow up in 4 weeks      Relevant Medications   buPROPion (WELLBUTRIN XL) 150 MG 24 hr tablet   Hyperlipidemia    Continue working on lifestyle modification      Restless legs - Primary    Continue taking iron supplement, use requip as needed      Other Visit Diagnoses     Iron deficiency       taking  iron supplement will check labs   Right hip pain       xray, may need to refer to ortho   Relevant Orders   DG Hip Unilat W OR W/O Pelvis 2-3 Views Right   Chronic pain of right knee       xray, may need to refer to ortho   Relevant Orders   DG Knee Complete 4 Views Right          Follow up plan: Return in about 4 weeks (around 10/03/2022) for follow up.

## 2022-09-05 NOTE — Assessment & Plan Note (Signed)
Trazodone as needed for sleep

## 2022-09-05 NOTE — Assessment & Plan Note (Signed)
Wean off lexapro, start wellbutrin

## 2022-09-18 ENCOUNTER — Other Ambulatory Visit: Payer: Self-pay | Admitting: Nurse Practitioner

## 2022-09-18 DIAGNOSIS — G8929 Other chronic pain: Secondary | ICD-10-CM

## 2022-09-18 DIAGNOSIS — G2581 Restless legs syndrome: Secondary | ICD-10-CM

## 2022-09-18 DIAGNOSIS — G4709 Other insomnia: Secondary | ICD-10-CM

## 2022-09-18 NOTE — Telephone Encounter (Signed)
Requested Prescriptions  Pending Prescriptions Disp Refills   traZODone (DESYREL) 50 MG tablet [Pharmacy Med Name: TRAZODONE 50 MG TABLET] 90 tablet 1    Sig: TAKE 0.5-1 TABLETS BY MOUTH AT BEDTIME AS NEEDED FOR SLEEP.     Psychiatry: Antidepressants - Serotonin Modulator Passed - 09/18/2022 10:12 AM      Passed - Completed PHQ-2 or PHQ-9 in the last 360 days      Passed - Valid encounter within last 6 months    Recent Outpatient Visits           1 week ago Restless legs   [facility] Berniece Salines, FNP   4 months ago Restless legs   [facility] Berniece Salines, FNP   8 months ago Class 2 obesity due to excess calories without serious comorbidity with body mass index (BMI) of 36.0 to 36.9 in adult   [facility] Berniece Salines, FNP   11 months ago Class 2 obesity due to excess calories without serious comorbidity with body mass index (BMI) of 36.0 to 36.9 in adult   [facility] Berniece Salines, FNP       Future Appointments             In 2 weeks Berniece Salines, FNP [facility]             rOPINIRole (REQUIP XL) 2 MG 24 hr tablet [Pharmacy Med Name: ROPINIROLE HCL ER 2 MG TABLET] 90 tablet 1    Sig: TAKE 1 TABLET BY MOUTH AT BEDTIME.     Neurology:  Parkinsonian Agents Passed - 09/18/2022 10:12 AM      Passed - Last BP in normal range    BP Readings from Last 1 Encounters:  09/05/22 122/74         Passed - Last Heart Rate in normal range    Pulse Readings from Last 1 Encounters:  09/05/22 84         Passed - Valid encounter within last 12 months    Recent Outpatient Visits           1 week ago Restless legs   [facility] Della Goo F, FNP   4 months ago Restless legs   [facility] Berniece Salines, FNP   8 months ago Class 2 obesity due to excess calories without  serious comorbidity with body mass index (BMI) of 36.0 to 36.9 in adult   [facility] Berniece Salines, FNP   11 months ago Class 2 obesity due to excess calories without serious comorbidity with body mass index (BMI) of 36.0 to 36.9 in adult   [facility] Berniece Salines, FNP       Future Appointments             In 2 weeks Zane Herald, Rudolpho Sevin, FNP [facility], [facility]             tiZANidine (ZANAFLEX) 4 MG tablet [Pharmacy Med Name: TIZANIDINE HCL 4 MG TABLET] 270 tablet     Sig: TAKE 1 TABLET BY MOUTH EVERY 8 HOURS AS NEEDED FOR MUSCLE SPASMS (MUSCLE TIGHTNESS).     Not Delegated - Cardiovascular:  Alpha-2 Agonists - tizanidine Failed - 09/18/2022 10:12 AM      Failed - This refill cannot be delegated  Passed - Valid encounter within last 6 months    Recent Outpatient Visits           1 week ago Restless legs   [facility] Della Goo F, FNP   4 months ago Restless legs   [facility] Berniece Salines, FNP   8 months ago Class 2 obesity due to excess calories without serious comorbidity with body mass index (BMI) of 36.0 to 36.9 in adult   [facility] Berniece Salines, FNP   11 months ago Class 2 obesity due to excess calories without serious comorbidity with body mass index (BMI) of 36.0 to 36.9 in adult   [facility] Berniece Salines, FNP       Future Appointments             In 2 weeks Zane Herald, Rudolpho Sevin, FNP [facility], [facility]

## 2022-09-18 NOTE — Telephone Encounter (Signed)
Requested medications are due for refill today.  no  Requested medications are on the active medications list.  no  Last refill. 10/10/2021  Future visit scheduled.   yes  Notes to clinic.  Refill/refusal not delegated.    Requested Prescriptions  Pending Prescriptions Disp Refills   tiZANidine (ZANAFLEX) 4 MG tablet [Pharmacy Med Name: TIZANIDINE HCL 4 MG TABLET] 270 tablet     Sig: TAKE 1 TABLET BY MOUTH EVERY 8 HOURS AS NEEDED FOR MUSCLE SPASMS (MUSCLE TIGHTNESS).     Not Delegated - Cardiovascular:  Alpha-2 Agonists - tizanidine Failed - 09/18/2022 10:12 AM      Failed - This refill cannot be delegated      Passed - Valid encounter within last 6 months    Recent Outpatient Visits           1 week ago Restless legs   [facility] Berniece Salines, FNP   4 months ago Restless legs   [facility] Berniece Salines, FNP   8 months ago Class 2 obesity due to excess calories without serious comorbidity with body mass index (BMI) of 36.0 to 36.9 in adult   [facility] Berniece Salines, FNP   11 months ago Class 2 obesity due to excess calories without serious comorbidity with body mass index (BMI) of 36.0 to 36.9 in adult   [facility] Berniece Salines, FNP       Future Appointments             In 2 weeks Zane Herald Rudolpho Sevin, FNP [facility]            Refused Prescriptions Disp Refills   traZODone (DESYREL) 50 MG tablet [Pharmacy Med Name: TRAZODONE 50 MG TABLET] 90 tablet 1    Sig: TAKE 0.5-1 TABLETS BY MOUTH AT BEDTIME AS NEEDED FOR SLEEP.     Psychiatry: Antidepressants - Serotonin Modulator Passed - 09/18/2022 10:12 AM      Passed - Completed PHQ-2 or PHQ-9 in the last 360 days      Passed - Valid encounter within last 6 months    Recent Outpatient Visits           1 week ago Restless legs   [facility]  Berniece Salines, FNP   4 months ago Restless legs   [facility] Berniece Salines, FNP   8 months ago Class 2 obesity due to excess calories without serious comorbidity with body mass index (BMI) of 36.0 to 36.9 in adult   [facility] Berniece Salines, FNP   11 months ago Class 2 obesity due to excess calories without serious comorbidity with body mass index (BMI) of 36.0 to 36.9 in adult   [facility] Berniece Salines, FNP       Future Appointments             In 2 weeks Berniece Salines, FNP [facility]             rOPINIRole (REQUIP XL) 2 MG 24 hr tablet [Pharmacy Med Name: ROPINIROLE HCL ER 2 MG TABLET] 90 tablet 1    Sig: TAKE 1 TABLET BY MOUTH AT BEDTIME.     Neurology:  Parkinsonian Agents Passed - 09/18/2022 10:12 AM      Passed - Last BP in  normal range    BP Readings from Last 1 Encounters:  09/05/22 122/74         Passed - Last Heart Rate in normal range    Pulse Readings from Last 1 Encounters:  09/05/22 84         Passed - Valid encounter within last 12 months    Recent Outpatient Visits           1 week ago Restless legs   [facility] Della Goo F, FNP   4 months ago Restless legs   [facility] Berniece Salines, FNP   8 months ago Class 2 obesity due to excess calories without serious comorbidity with body mass index (BMI) of 36.0 to 36.9 in adult   [facility] Berniece Salines, FNP   11 months ago Class 2 obesity due to excess calories without serious comorbidity with body mass index (BMI) of 36.0 to 36.9 in adult   [facility] Berniece Salines, FNP       Future Appointments             In 2 weeks Zane Herald, Rudolpho Sevin, FNP [facility], [facility]

## 2022-09-19 ENCOUNTER — Encounter: Payer: Self-pay | Admitting: Nurse Practitioner

## 2022-09-29 ENCOUNTER — Other Ambulatory Visit: Payer: Self-pay | Admitting: Nurse Practitioner

## 2022-09-29 DIAGNOSIS — F419 Anxiety disorder, unspecified: Secondary | ICD-10-CM

## 2022-09-29 DIAGNOSIS — F331 Major depressive disorder, recurrent, moderate: Secondary | ICD-10-CM

## 2022-10-01 NOTE — Telephone Encounter (Signed)
Requested Prescriptions  Pending Prescriptions Disp Refills   buPROPion (WELLBUTRIN XL) 150 MG 24 hr tablet [Pharmacy Med Name: BUPROPION HCL XL 150 MG TABLET] 90 tablet 0    Sig: TAKE 1 TABLET BY MOUTH EVERY DAY     Psychiatry: Antidepressants - bupropion Passed - 09/29/2022 12:31 PM      Passed - Cr in normal range and within 360 days    Creat  Date Value Ref Range Status  05/09/2022 0.78 0.50 - 1.03 mg/dL Final         Passed - AST in normal range and within 360 days    AST  Date Value Ref Range Status  05/09/2022 20 10 - 35 U/L Final         Passed - ALT in normal range and within 360 days    ALT  Date Value Ref Range Status  05/09/2022 17 6 - 29 U/L Final         Passed - Completed PHQ-2 or PHQ-9 in the last 360 days      Passed - Last BP in normal range    BP Readings from Last 1 Encounters:  09/05/22 122/74         Passed - Valid encounter within last 6 months    Recent Outpatient Visits           3 weeks ago Restless legs   [facility] Berniece Salines, FNP   4 months ago Restless legs   [facility] Berniece Salines, FNP   8 months ago Class 2 obesity due to excess calories without serious comorbidity with body mass index (BMI) of 36.0 to 36.9 in adult   [facility] Berniece Salines, FNP   11 months ago Class 2 obesity due to excess calories without serious comorbidity with body mass index (BMI) of 36.0 to 36.9 in adult   [facility] Berniece Salines, FNP       Future Appointments             In 5 days Zane Herald, Rudolpho Sevin, FNP [facility], [facility]

## 2022-10-06 ENCOUNTER — Encounter: Payer: Self-pay | Admitting: Nurse Practitioner

## 2022-10-06 ENCOUNTER — Ambulatory Visit (INDEPENDENT_AMBULATORY_CARE_PROVIDER_SITE_OTHER): Payer: BC Managed Care – PPO | Admitting: Nurse Practitioner

## 2022-10-06 ENCOUNTER — Other Ambulatory Visit: Payer: Self-pay

## 2022-10-06 VITALS — BP 120/70 | HR 85 | Temp 98.1°F | Resp 16 | Ht 62.0 in | Wt 174.1 lb

## 2022-10-06 DIAGNOSIS — E6609 Other obesity due to excess calories: Secondary | ICD-10-CM | POA: Diagnosis not present

## 2022-10-06 DIAGNOSIS — G4709 Other insomnia: Secondary | ICD-10-CM

## 2022-10-06 DIAGNOSIS — F331 Major depressive disorder, recurrent, moderate: Secondary | ICD-10-CM | POA: Diagnosis not present

## 2022-10-06 DIAGNOSIS — F419 Anxiety disorder, unspecified: Secondary | ICD-10-CM

## 2022-10-06 DIAGNOSIS — Z6831 Body mass index (BMI) 31.0-31.9, adult: Secondary | ICD-10-CM

## 2022-10-06 MED ORDER — METFORMIN HCL 500 MG PO TABS
500.0000 mg | ORAL_TABLET | Freq: Two times a day (BID) | ORAL | 0 refills | Status: DC
Start: 2022-10-06 — End: 2023-01-02

## 2022-10-06 MED ORDER — FLUOXETINE HCL 10 MG PO TABS
10.0000 mg | ORAL_TABLET | Freq: Every day | ORAL | 0 refills | Status: DC
Start: 2022-10-06 — End: 2022-11-03

## 2022-10-06 MED ORDER — EPINEPHRINE 0.3 MG/0.3ML IJ SOAJ: 0.3000 mg | INTRAMUSCULAR | 3 refills | Status: AC | PRN

## 2022-10-06 NOTE — Assessment & Plan Note (Signed)
Continue trazodone as needed. 

## 2022-10-06 NOTE — Assessment & Plan Note (Signed)
Start fluoxetine 10 mg daily, follow up in 4 weeks.

## 2022-10-06 NOTE — Progress Notes (Signed)
BP 120/70   Pulse 85   Temp 98.1 F (36.7 C) (Oral)   Resp 16   Ht 5\' 2"  (1.575 m)   Wt 174 lb 1.6 oz (79 kg)   SpO2 98%   BMI 31.84 kg/m    Subjective:    Patient ID: Morgan Kemp, female    DOB: [birth year], 52 y.o.   MRN: [id number]  HPI: Morgan Kemp is a 52 y.o. female  Chief Complaint  Patient presents with   Follow-up    Weight loss medication, Patient stopped Qsymia do to panic attack   Depression    Stopped wellbutrin do to side effects   Last office visit, started patient on qsymia and wellbutrin, patient had sent a message that she was having a reaction.  She reported dizziness and increased anxiety with panic attacks.  Instructed patient to stop qsymia and continue wellbutrin and see how she did with that.  She did not tolerate the wellbutrin, it gave her headaches.    Obesity:  Current weight : 174 lbs BMI: 31.84 Previous weight:173 lbs Treatment Tried: phentermine, qsymia, wellbutrin, lifestyle modification, will try metformin Comorbidities: depression, anxiety, HLD  Depression/anxiety/insomnia Medication: trazodone for sleep, will try fluoxetine, discussed with patient that our next option would be to go to psychiatry.   Compliant: stopped wellbutrin Side:  headaches PHQ9 positive GAD positive Previous meds:  zoloft, effexor and lexapro, wellburtrin      10/06/2022    8:37 AM 09/05/2022    1:50 PM 05/09/2022    8:29 AM 01/08/2022    8:41 AM 10/10/2021   10:09 AM  Depression screen PHQ 2/9  Decreased Interest 1 0 0 0 0  Down, Depressed, Hopeless 1 0 0 0 0  PHQ - 2 Score 2 0 0 0 0  Altered sleeping 1 1 0 3 0  Tired, decreased energy 2 0 0 0 0  Change in appetite 2 0 0 0 0  Feeling bad or failure about yourself  2 0 0 0 0  Trouble concentrating 2 1 0 1 0  Moving slowly or fidgety/restless 2 0 0 0 0  Suicidal thoughts 0 0 0 0 0  PHQ-9 Score 13 2 0 4 0  Difficult doing work/chores Somewhat difficult Not difficult at all Not difficult at  all Not difficult at all        10/06/2022    8:37 AM 09/05/2022    1:51 PM 01/08/2022    8:41 AM 10/10/2021   10:09 AM  GAD 7 : Generalized Anxiety Score  Nervous, Anxious, on Edge 2 3 0 0  Control/stop worrying 2 3 0 0  Worry too much - different things 2 3 1  0  Trouble relaxing 2 1 0 0  Restless 2 0 0 0  Easily annoyed or irritable 2 1 0 0  Afraid - awful might happen 2 0 0 0  Total GAD 7 Score 14 11 1  0  Anxiety Difficulty Somewhat difficult Somewhat difficult Not difficult at all Not difficult at all     Relevant past medical, surgical, family and social history reviewed and updated as indicated. Interim medical history since our last visit reviewed. Allergies and medications reviewed and updated.  Review of Systems  Constitutional: Negative for fever or weight change.  Respiratory: Negative for cough and shortness of breath.   Cardiovascular: Negative for chest pain or palpitations.  Gastrointestinal: Negative for abdominal pain, no bowel changes.  Musculoskeletal: Negative for gait problem or joint swelling.  Positive right hip and right knee pain Skin: Negative for rash.  Neurological: Negative for dizziness or headache.  No other specific complaints in a complete review of systems (except as listed in HPI above).      Objective:    BP 120/70   Pulse 85   Temp 98.1 F (36.7 C) (Oral)   Resp 16   Ht 5\' 2"  (1.575 m)   Wt 174 lb 1.6 oz (79 kg)   SpO2 98%   BMI 31.84 kg/m   Wt Readings from Last 3 Encounters:  10/06/22 174 lb 1.6 oz (79 kg)  09/05/22 173 lb 11.2 oz (78.8 kg)  06/13/22 166 lb (75.3 kg)    Physical Exam  Constitutional: Patient appears well-developed and well-nourished. Obese  No distress.  HEENT: head atraumatic, normocephalic, pupils equal and reactive to light, neck supple Cardiovascular: Normal rate, regular rhythm and normal heart sounds.  No murmur heard. No BLE edema. Pulmonary/Chest: Effort normal and breath sounds normal. No respiratory  distress. Abdominal: Soft.  There is no tenderness. Psychiatric: Patient has a normal mood and affect. behavior is normal. Judgment and thought content normal.   Results for orders placed or performed during the hospital encounter of 06/13/22  Surgical pathology  Result Value Ref Range   SURGICAL PATHOLOGY      SURGICAL PATHOLOGY CASE: [id number] PATIENT: Avie Echevaria Surgical Pathology Report     Specimen Submitted: A. Colon polyp, cecum; cold snare B. Colon polyp, ascending; cold snare  Clinical History: Screening colonoscopy.  Polyps      DIAGNOSIS: A.  COLON POLYP, CECUM; COLD SNARE: - TUBULAR ADENOMA. - NEGATIVE FOR HIGH-GRADE DYSPLASIA AND MALIGNANCY.  B.  COLON POLYP, ASCENDING; COLD SNARE: - TUBULAR ADENOMA. - NEGATIVE FOR HIGH-GRADE DYSPLASIA AND MALIGNANCY.  GROSS DESCRIPTION: A. Labeled: Cold snare cecal polyp Received: Formalin Collection time: 11:51 AM on 06/13/2022 Placed into formalin time: 11:51 AM on 06/13/2022 Tissue fragment(s): Multiple Size: Aggregate, 1.2 x 0.3 x 0.1 cm Description: Tan-pink soft tissue fragments Entirely submitted in 1 cassette.  B. Labeled: Cold snare ascending colon polyp Received: Formalin Collection time: 11:55 AM on 06/13/2022 Placed into formalin time: 11:55 AM on 06/13/2022 Tissue fragment(s): Multiple S ize: Aggregate, 0.5 x 0.5 x 0.1 cm Description: Tan soft tissue fragments Entirely submitted in 1 cassette.  CM 06/13/2022  Final Diagnosis performed by Elijah Birk, MD.   Electronically signed 06/16/2022 10:24:28AM The electronic signature indicates that the named Attending Pathologist has evaluated the specimen Technical component performed at [facility], [address], [place], [place] [address] Lab: [phone] Dir: Jolene Schimke, MD, MMM  Professional component performed at [facility], [facility], [address] [place], [place], [place] [address] Lab: [phone] Dir: Beryle Quant, MD        Assessment & Plan:   Problem List Items Addressed This Visit       Other   Depression, major, recurrent, moderate (HCC)    Start fluoxetine 10 mg daily, follow up in 4 weeks.       Relevant Medications   FLUoxetine (PROZAC) 10 MG tablet   Other insomnia    Continue trazodone as needed      Obese    Start metformin 500 mg daily for one week, then increase to two times a day.      Relevant Medications   metFORMIN (GLUCOPHAGE) 500 MG tablet   Anxiety - Primary    Start fluoxetine 10 mg daily, follow up in 4 weeks.  Relevant Medications   FLUoxetine (PROZAC) 10 MG tablet        Follow up plan: Return in about 4 weeks (around 11/03/2022) for follow up.

## 2022-10-06 NOTE — Assessment & Plan Note (Signed)
Start metformin 500 mg daily for one week, then increase to two times a day.

## 2022-10-10 ENCOUNTER — Other Ambulatory Visit: Payer: Self-pay | Admitting: Oncology

## 2022-10-10 DIAGNOSIS — Z006 Encounter for examination for normal comparison and control in clinical research program: Secondary | ICD-10-CM

## 2022-10-28 ENCOUNTER — Other Ambulatory Visit: Payer: Self-pay | Admitting: Nurse Practitioner

## 2022-10-28 DIAGNOSIS — F331 Major depressive disorder, recurrent, moderate: Secondary | ICD-10-CM

## 2022-10-28 DIAGNOSIS — F419 Anxiety disorder, unspecified: Secondary | ICD-10-CM

## 2022-10-29 NOTE — Telephone Encounter (Signed)
Requested Prescriptions  Pending Prescriptions Disp Refills   FLUoxetine (PROZAC) 10 MG tablet [Pharmacy Med Name: FLUOXETINE HCL 10 MG TABLET] 90 tablet 1    Sig: TAKE 1 TABLET BY MOUTH EVERY DAY     Psychiatry:  Antidepressants - SSRI Passed - 10/28/2022  2:33 PM      Passed - Completed PHQ-2 or PHQ-9 in the last 360 days      Passed - Valid encounter within last 6 months    Recent Outpatient Visits           3 weeks ago Anxiety   [facility] Berniece Salines, FNP   1 month ago Restless legs   [facility] Della Goo F, FNP   5 months ago Restless legs   [facility] Berniece Salines, FNP   9 months ago Class 2 obesity due to excess calories without serious comorbidity with body mass index (BMI) of 36.0 to 36.9 in adult   [facility] Berniece Salines, FNP   1 year ago Class 2 obesity due to excess calories without serious comorbidity with body mass index (BMI) of 36.0 to 36.9 in adult   [facility] Berniece Salines, FNP       Future Appointments             In 5 days Zane Herald, Rudolpho Sevin, FNP [facility], [facility]

## 2022-11-03 ENCOUNTER — Other Ambulatory Visit: Payer: Self-pay

## 2022-11-03 ENCOUNTER — Ambulatory Visit (INDEPENDENT_AMBULATORY_CARE_PROVIDER_SITE_OTHER): Payer: BC Managed Care – PPO | Admitting: Nurse Practitioner

## 2022-11-03 ENCOUNTER — Other Ambulatory Visit: Payer: Self-pay | Admitting: Nurse Practitioner

## 2022-11-03 ENCOUNTER — Encounter: Payer: Self-pay | Admitting: Nurse Practitioner

## 2022-11-03 VITALS — BP 120/72 | HR 88 | Temp 97.9°F | Resp 16 | Ht 62.0 in | Wt 176.3 lb

## 2022-11-03 DIAGNOSIS — E6609 Other obesity due to excess calories: Secondary | ICD-10-CM | POA: Diagnosis not present

## 2022-11-03 DIAGNOSIS — G4709 Other insomnia: Secondary | ICD-10-CM

## 2022-11-03 DIAGNOSIS — F419 Anxiety disorder, unspecified: Secondary | ICD-10-CM

## 2022-11-03 DIAGNOSIS — Z6831 Body mass index (BMI) 31.0-31.9, adult: Secondary | ICD-10-CM

## 2022-11-03 DIAGNOSIS — F331 Major depressive disorder, recurrent, moderate: Secondary | ICD-10-CM

## 2022-11-03 DIAGNOSIS — N951 Menopausal and female climacteric states: Secondary | ICD-10-CM | POA: Insufficient documentation

## 2022-11-03 MED ORDER — FLUOXETINE HCL 10 MG PO TABS
10.0000 mg | ORAL_TABLET | Freq: Every day | ORAL | 1 refills | Status: DC
Start: 2022-11-03 — End: 2023-05-28

## 2022-11-03 MED ORDER — BIJUVA 0.5-100 MG PO CAPS: 1.0000 | ORAL_CAPSULE | Freq: Every day | ORAL | 1 refills | Status: DC

## 2022-11-03 NOTE — Assessment & Plan Note (Signed)
Continue working on lifestyle modification,  also taking metformin

## 2022-11-03 NOTE — Progress Notes (Signed)
BP 120/72   Pulse 88   Temp 97.9 F (36.6 C) (Oral)   Resp 16   Ht 5\' 2"  (1.575 m)   Wt 176 lb 4.8 oz (80 kg)   SpO2 99%   BMI 32.25 kg/m    Subjective:    Patient ID: Morgan Kemp, female    DOB: [birth year], 52 y.o.   MRN: [id number]  HPI: Morgan Kemp is a 52 y.o. female  Chief Complaint  Patient presents with   Depression   Anxiety    4 week recheck   Obesity:  Current weight : 176 lbs BMI: 32.25 Previous weight:174 lbs Treatment Tried:  phentermine, qsymia, wellbutrin, lifestyle modification, metformin Comorbidities: HLD, anxiety, depression   Depression/anxiety/insomnia Last appointment plan was to take trazodone for sleep, will try fluoxetine, discussed with patient that our next option would be to go to psychiatry.   Medication fluoxetine 10 mg daily, trazodone  Compliant yes, patient reports she is sleeping much better and has seen an improvement in her mood.  Side effects none PHQ9 negative GAD improved Previous meds: zoloft, effexor and lexapro, wellburtrin      11/03/2022    8:14 AM 10/06/2022    8:37 AM 09/05/2022    1:50 PM 05/09/2022    8:29 AM 01/08/2022    8:41 AM  Depression screen PHQ 2/9  Decreased Interest 0 1 0 0 0  Down, Depressed, Hopeless 0 1 0 0 0  PHQ - 2 Score 0 2 0 0 0  Altered sleeping 0 1 1 0 3  Tired, decreased energy 0 2 0 0 0  Change in appetite 1 2 0 0 0  Feeling bad or failure about yourself  0 2 0 0 0  Trouble concentrating 1 2 1  0 1  Moving slowly or fidgety/restless 0 2 0 0 0  Suicidal thoughts 0 0 0 0 0  PHQ-9 Score 2 13 2  0 4  Difficult doing work/chores Not difficult at all Somewhat difficult Not difficult at all Not difficult at all Not difficult at all       11/03/2022    8:16 AM 10/06/2022    8:37 AM 09/05/2022    1:51 PM 01/08/2022    8:41 AM  GAD 7 : Generalized Anxiety Score  Nervous, Anxious, on Edge 1 2 3  0  Control/stop worrying 1 2 3  0  Worry too much - different things 1 2 3 1   Trouble  relaxing 1 2 1  0  Restless 0 2 0 0  Easily annoyed or irritable 1 2 1  0  Afraid - awful might happen 1 2 0 0  Total GAD 7 Score 6 14 11 1   Anxiety Difficulty Not difficult at all Somewhat difficult Somewhat difficult Not difficult at all    Vasomotor symptoms due to menopause: patient has been taking some black cohosh and says it has helped with her hot flashes. Patient reports she has done her research about hormone replacement and would like to start.  Sill start her on estradiol-progesterone combo.    Relevant past medical, surgical, family and social history reviewed and updated as indicated. Interim medical history since our last visit reviewed. Allergies and medications reviewed and updated.  Review of Systems  Constitutional: Negative for fever or weight change.  Respiratory: Negative for cough and shortness of breath.   Cardiovascular: Negative for chest pain or palpitations.  Gastrointestinal: Negative for abdominal pain, no bowel changes.  Musculoskeletal: Negative for gait problem or joint swelling.  Skin: Negative for rash.  Neurological: Negative for dizziness or headache.  No other specific complaints in a complete review of systems (except as listed in HPI above).      Objective:    BP 120/72   Pulse 88   Temp 97.9 F (36.6 C) (Oral)   Resp 16   Ht 5\' 2"  (1.575 m)   Wt 176 lb 4.8 oz (80 kg)   SpO2 99%   BMI 32.25 kg/m   Wt Readings from Last 3 Encounters:  11/03/22 176 lb 4.8 oz (80 kg)  10/06/22 174 lb 1.6 oz (79 kg)  09/05/22 173 lb 11.2 oz (78.8 kg)    Physical Exam  Constitutional: Patient appears well-developed and well-nourished. Obese  No distress.  HEENT: head atraumatic, normocephalic, pupils equal and reactive to light, neck supple Cardiovascular: Normal rate, regular rhythm and normal heart sounds.  No murmur heard. No BLE edema. Pulmonary/Chest: Effort normal and breath sounds normal. No respiratory distress. Abdominal: Soft.  There is no  tenderness. Psychiatric: Patient has a normal mood and affect. behavior is normal. Judgment and thought content normal.   Results for orders placed or performed during the hospital encounter of 06/13/22  Surgical pathology  Result Value Ref Range   SURGICAL PATHOLOGY      SURGICAL PATHOLOGY CASE: [id number] PATIENT: Morgan Kemp Surgical Pathology Report     Specimen Submitted: A. Colon polyp, cecum; cold snare B. Colon polyp, ascending; cold snare  Clinical History: Screening colonoscopy.  Polyps      DIAGNOSIS: A.  COLON POLYP, CECUM; COLD SNARE: - TUBULAR ADENOMA. - NEGATIVE FOR HIGH-GRADE DYSPLASIA AND MALIGNANCY.  B.  COLON POLYP, ASCENDING; COLD SNARE: - TUBULAR ADENOMA. - NEGATIVE FOR HIGH-GRADE DYSPLASIA AND MALIGNANCY.  GROSS DESCRIPTION: A. Labeled: Cold snare cecal polyp Received: Formalin Collection time: 11:51 AM on 06/13/2022 Placed into formalin time: 11:51 AM on 06/13/2022 Tissue fragment(s): Multiple Size: Aggregate, 1.2 x 0.3 x 0.1 cm Description: Tan-pink soft tissue fragments Entirely submitted in 1 cassette.  B. Labeled: Cold snare ascending colon polyp Received: Formalin Collection time: 11:55 AM on 06/13/2022 Placed into formalin time: 11:55 AM on 06/13/2022 Tissue fragment(s): Multiple S ize: Aggregate, 0.5 x 0.5 x 0.1 cm Description: Tan soft tissue fragments Entirely submitted in 1 cassette.  CM 06/13/2022  Final Diagnosis performed by Elijah Birk, MD.   Electronically signed 06/16/2022 10:24:28AM The electronic signature indicates that the named Attending Pathologist has evaluated the specimen Technical component performed at [facility], [address], [place], [place] [address] Lab: [phone] Dir: Jolene Schimke, MD, MMM  Professional component performed at [facility], [facility], [address] [place], [place], [place] [address] Lab: [phone] Dir: Beryle Quant, MD       Assessment & Plan:   Problem List  Items Addressed This Visit       Other   Depression, major, recurrent, moderate (HCC)    Continue  fluoxetine 10 mg daily, trazodone       Relevant Medications   FLUoxetine (PROZAC) 10 MG tablet   Other insomnia    Continue  fluoxetine 10 mg daily, trazodone       Obese - Primary    Continue working on lifestyle modification,  also taking metformin      Anxiety    Continue  fluoxetine 10 mg daily, trazodone       Relevant Medications   FLUoxetine (PROZAC) 10 MG tablet   Vasomotor symptoms due to menopause    Continue black cohosh,  start  estradiol-progesterone combo.       Relevant Medications   Estradiol-Progesterone (BIJUVA) 0.5-100 MG CAPS         Follow up plan: Return in about 3 months (around 02/02/2023) for follow up.

## 2022-11-03 NOTE — Assessment & Plan Note (Signed)
Continue black cohosh,  start estradiol-progesterone combo.

## 2022-11-03 NOTE — Assessment & Plan Note (Signed)
Continue  fluoxetine 10 mg daily, trazodone

## 2022-11-05 ENCOUNTER — Other Ambulatory Visit: Payer: Self-pay | Admitting: Nurse Practitioner

## 2022-11-05 ENCOUNTER — Encounter: Payer: Self-pay | Admitting: Nurse Practitioner

## 2022-11-05 DIAGNOSIS — N951 Menopausal and female climacteric states: Secondary | ICD-10-CM

## 2022-11-05 MED ORDER — ESTRADIOL-NORETHINDRONE ACET 1-0.5 MG PO TABS
1.0000 | ORAL_TABLET | Freq: Every day | ORAL | 12 refills | Status: AC
Start: 2022-11-05 — End: ?

## 2022-11-05 NOTE — Telephone Encounter (Signed)
Requested Prescriptions  Pending Prescriptions Disp Refills   traZODone (DESYREL) 50 MG tablet [Pharmacy Med Name: TRAZODONE 50 MG TABLET] 90 tablet 1    Sig: TAKE 0.5-1 TABLETS BY MOUTH AT BEDTIME AS NEEDED FOR SLEEP.     Psychiatry: Antidepressants - Serotonin Modulator Passed - 11/03/2022  7:04 PM      Passed - Completed PHQ-2 or PHQ-9 in the last 360 days      Passed - Valid encounter within last 6 months    Recent Outpatient Visits           2 days ago Class 1 obesity due to excess calories without serious comorbidity with body mass index (BMI) of 31.0 to 31.9 in adult   [facility] Berniece Salines, FNP   1 month ago Anxiety   [facility] Berniece Salines, FNP   2 months ago Restless legs   [facility] Berniece Salines, FNP   6 months ago Restless legs   [facility] Berniece Salines, FNP   10 months ago Class 2 obesity due to excess calories without serious comorbidity with body mass index (BMI) of 36.0 to 36.9 in adult   [facility] Berniece Salines, FNP       Future Appointments             In 2 months Zane Herald, Rudolpho Sevin, FNP [facility], [facility]

## 2023-01-01 ENCOUNTER — Other Ambulatory Visit: Payer: Self-pay | Admitting: Nurse Practitioner

## 2023-01-01 DIAGNOSIS — E66811 Obesity, class 1: Secondary | ICD-10-CM

## 2023-01-01 NOTE — Telephone Encounter (Signed)
Requested medications are due for refill today.  yes  Requested medications are on the active medications list.  yes  Last refill. 10/06/2022 #180 4XL  Future visit scheduled.   yes  Notes to clinic.  Labs are expired.    Requested Prescriptions  Pending Prescriptions Disp Refills   metFORMIN (GLUCOPHAGE) 500 MG tablet [Pharmacy Med Name: METFORMIN HCL 500 MG TABLET] 180 tablet 0    Sig: TAKE 1 TABLET BY MOUTH 2 TIMES DAILY WITH A MEAL.     Endocrinology:  Diabetes - Biguanides Failed - 01/01/2023  1:26 AM      Failed - HBA1C is between 0 and 7.9 and within 180 days    Hgb A1c MFr Bld  Date Value Ref Range Status  10/10/2021 5.5 <5.7 % of total Hgb Final    Comment:    For the purpose of screening for the presence of diabetes: . <5.7%       Consistent with the absence of diabetes 5.7-6.4%    Consistent with increased risk for diabetes             (prediabetes) > or =6.5%  Consistent with diabetes . This assay result is consistent with a decreased risk of diabetes. . Currently, no consensus exists regarding use of hemoglobin A1c for diagnosis of diabetes in children. . According to American Diabetes Association (ADA) guidelines, hemoglobin A1c <7.0% represents optimal control in non-pregnant diabetic patients. Different metrics may apply to specific patient populations.  Standards of Medical Care in Diabetes(ADA). .          Failed - B12 Level in normal range and within 720 days    No results found for: "VITAMINB12"       Passed - Cr in normal range and within 360 days    Creat  Date Value Ref Range Status  05/09/2022 0.78 0.50 - 1.03 mg/dL Final         Passed - eGFR in normal range and within 360 days    GFR  Date Value Ref Range Status  06/18/2015 111.52 >60.00 mL/min Final   eGFR  Date Value Ref Range Status  05/09/2022 92 > OR = 60 mL/min/1.10m2 Final         Passed - Valid encounter within last 6 months    Recent Outpatient Visits           1  month ago Class 1 obesity due to excess calories without serious comorbidity with body mass index (BMI) of 31.0 to 31.9 in adult   [facility] Berniece Salines, FNP   2 months ago Anxiety   [facility] Berniece Salines, FNP   3 months ago Restless legs   [facility] Della Goo F, FNP   7 months ago Restless legs   [facility] Berniece Salines, FNP   11 months ago Class 2 obesity due to excess calories without serious comorbidity with body mass index (BMI) of 36.0 to 36.9 in adult   [facility] Berniece Salines, FNP       Future Appointments             In 1 month Zane Herald, Rudolpho Sevin, FNP [facility]            Passed - CBC within normal limits and completed in the last 12 months    WBC  Date Value Ref Range Status  05/09/2022 4.7 3.8 - 10.8 Thousand/uL Final   RBC  Date Value Ref Range Status  05/09/2022 4.57 3.80 - 5.10 Million/uL Final   Hemoglobin  Date Value Ref Range Status  05/09/2022 12.5 11.7 - 15.5 g/dL Final  62/13/0865 78.4 11.1 - 15.9 g/dL Final   HCT  Date Value Ref Range Status  05/09/2022 39.4 35.0 - 45.0 % Final   Hematocrit  Date Value Ref Range Status  10/09/2020 37.0 34.0 - 46.6 % Final   MCHC  Date Value Ref Range Status  05/09/2022 31.7 (L) 32.0 - 36.0 g/dL Final   [facility]  Date Value Ref Range Status  05/09/2022 27.4 27.0 - 33.0 pg Final   MCV  Date Value Ref Range Status  05/09/2022 86.2 80.0 - 100.0 fL Final  10/09/2020 83 79 - 97 fL Final   No results found for: "PLTCOUNTKUC", "LABPLAT", "POCPLA" RDW  Date Value Ref Range Status  05/09/2022 13.1 11.0 - 15.0 % Final  10/09/2020 14.6 11.7 - 15.4 % Final

## 2023-02-02 ENCOUNTER — Encounter: Payer: Self-pay | Admitting: Nurse Practitioner

## 2023-02-02 ENCOUNTER — Ambulatory Visit (INDEPENDENT_AMBULATORY_CARE_PROVIDER_SITE_OTHER): Payer: BC Managed Care – PPO | Admitting: Nurse Practitioner

## 2023-02-02 VITALS — BP 112/72 | HR 90 | Temp 98.0°F | Resp 14 | Ht 62.0 in | Wt 180.8 lb

## 2023-02-02 DIAGNOSIS — F331 Major depressive disorder, recurrent, moderate: Secondary | ICD-10-CM

## 2023-02-02 DIAGNOSIS — G4709 Other insomnia: Secondary | ICD-10-CM

## 2023-02-02 DIAGNOSIS — Z131 Encounter for screening for diabetes mellitus: Secondary | ICD-10-CM

## 2023-02-02 DIAGNOSIS — E785 Hyperlipidemia, unspecified: Secondary | ICD-10-CM | POA: Diagnosis not present

## 2023-02-02 DIAGNOSIS — Z23 Encounter for immunization: Secondary | ICD-10-CM | POA: Diagnosis not present

## 2023-02-02 DIAGNOSIS — Z130 Encounter for screening for diseases of the blood and blood-forming organs and certain disorders involving the immune mechanism: Secondary | ICD-10-CM

## 2023-02-02 DIAGNOSIS — N951 Menopausal and female climacteric states: Secondary | ICD-10-CM

## 2023-02-02 DIAGNOSIS — Z6836 Body mass index (BMI) 36.0-36.9, adult: Secondary | ICD-10-CM

## 2023-02-02 DIAGNOSIS — F419 Anxiety disorder, unspecified: Secondary | ICD-10-CM

## 2023-02-02 DIAGNOSIS — Z7984 Long term (current) use of oral hypoglycemic drugs: Secondary | ICD-10-CM

## 2023-02-02 DIAGNOSIS — E66812 Obesity, class 2: Secondary | ICD-10-CM

## 2023-02-02 NOTE — Progress Notes (Signed)
BP 112/72 (BP Location: Right Arm, Patient Position: Sitting, Cuff Size: Large)   Pulse 90   Temp 98 F (36.7 C) (Oral)   Resp 14   Ht 5\' 2"  (1.575 m)   Wt 180 lb 12.8 oz (82 kg)   SpO2 97%   BMI 33.07 kg/m    Subjective:    Patient ID: Morgan Kemp, female    DOB: 02-13-1971, 52 y.o.   MRN: [id number]  HPI: Morgan Kemp is a 52 y.o. female  Chief Complaint  Patient presents with   Medical Management of Chronic Issues    Discussed the use of AI scribe software for clinical note transcription with the patient, who gave verbal consent to proceed.  History of Present Illness   The patient, with a history of vasomotor symptoms due to menopause, insomnia, obesity, hyperlipidemia, depression and anxiety, reports improvement in symptoms. She is currently taking Activella, Prozac, metformin, and trazodone. She reports that the hormones are working.  Hot flashes have subsided significantly. Asthma has been stable with no issues. Mood has improved with Prozac and sleep has improved with the control of hot flashes. She has started working out twice a week with a Systems analyst for [employer] and self-care. She is also working from home now. She has filed for [place] disability due to back issues and is working on strengthening her core muscles to reduce pain.       02/02/2023    7:59 AM 11/03/2022    8:14 AM 10/06/2022    8:37 AM  Depression screen PHQ 2/9  Decreased Interest 0 0 1  Down, Depressed, Hopeless 0 0 1  PHQ - 2 Score 0 0 2  Altered sleeping 0 0 1  Tired, decreased energy 0 0 2  Change in appetite 0 1 2  Feeling bad or failure about yourself  0 0 2  Trouble concentrating 1 1 2   Moving slowly or fidgety/restless 1 0 2  Suicidal thoughts 0 0 0  PHQ-9 Score 2 2 13   Difficult doing work/chores Somewhat difficult Not difficult at all Somewhat difficult       02/02/2023    8:00 AM 11/03/2022    8:16 AM 10/06/2022    8:37 AM 09/05/2022    1:51 PM  GAD 7 :  Generalized Anxiety Score  Nervous, Anxious, on Edge 1 1 2 3   Control/stop worrying 1 1 2 3   Worry too much - different things 1 1 2 3   Trouble relaxing 0 1 2 1   Restless 0 0 2 0  Easily annoyed or irritable 1 1 2 1   Afraid - awful might happen 0 1 2 0  Total GAD 7 Score 4 6 14 11   Anxiety Difficulty Not difficult at all Not difficult at all Somewhat difficult Somewhat difficult     Relevant past medical, surgical, family and social history reviewed and updated as indicated. Interim medical history since our last visit reviewed. Allergies and medications reviewed and updated.  Review of Systems  Constitutional: Negative for fever or weight change.  Respiratory: Negative for cough and shortness of breath.   Cardiovascular: Negative for chest pain or palpitations.  Gastrointestinal: Negative for abdominal pain, no bowel changes.  Musculoskeletal: Negative for gait problem or joint swelling.  Skin: Negative for rash.  Neurological: Negative for dizziness or headache.  No other specific complaints in a complete review of systems (except as listed in HPI above).      Objective:    BP  112/72 (BP Location: Right Arm, Patient Position: Sitting, Cuff Size: Large)   Pulse 90   Temp 98 F (36.7 C) (Oral)   Resp 14   Ht 5\' 2"  (1.575 m)   Wt 180 lb 12.8 oz (82 kg)   SpO2 97%   BMI 33.07 kg/m    Wt Readings from Last 3 Encounters:  02/02/23 180 lb 12.8 oz (82 kg)  11/03/22 176 lb 4.8 oz (80 kg)  10/06/22 174 lb 1.6 oz (79 kg)    Physical Exam  Constitutional: Patient appears well-developed and well-nourished. Obese  No distress.  HEENT: head atraumatic, normocephalic, pupils equal and reactive to light, neck supple, throat within normal limits Cardiovascular: Normal rate, regular rhythm and normal heart sounds.  No murmur heard. No BLE edema. Pulmonary/Chest: Effort normal and breath sounds normal. No respiratory distress. Abdominal: Soft.  There is no tenderness. Psychiatric:  Patient has a normal mood and affect. behavior is normal. Judgment and thought content normal.  Results for orders placed or performed during the hospital encounter of 06/13/22  Surgical pathology   Collection Time: 06/13/22 11:51 AM  Result Value Ref Range   SURGICAL PATHOLOGY      SURGICAL PATHOLOGY CASE: [id number] PATIENT: Morgan Kemp Surgical Pathology Report     Specimen Submitted: A. Colon polyp, cecum; cold snare B. Colon polyp, ascending; cold snare  Clinical History: Screening colonoscopy.  Polyps      DIAGNOSIS: A.  COLON POLYP, CECUM; COLD SNARE: - TUBULAR ADENOMA. - NEGATIVE FOR HIGH-GRADE DYSPLASIA AND MALIGNANCY.  B.  COLON POLYP, ASCENDING; COLD SNARE: - TUBULAR ADENOMA. - NEGATIVE FOR HIGH-GRADE DYSPLASIA AND MALIGNANCY.  GROSS DESCRIPTION: A. Labeled: Cold snare cecal polyp Received: Formalin Collection time: 11:51 AM on 06/13/2022 Placed into formalin time: 11:51 AM on 06/13/2022 Tissue fragment(s): Multiple Size: Aggregate, 1.2 x 0.3 x 0.1 cm Description: Tan-pink soft tissue fragments Entirely submitted in 1 cassette.  B. Labeled: Cold snare ascending colon polyp Received: Formalin Collection time: 11:55 AM on 06/13/2022 Placed into formalin time: 11:55 AM on 06/13/2022 Tissue fragment(s): Multiple S ize: Aggregate, 0.5 x 0.5 x 0.1 cm Description: Tan soft tissue fragments Entirely submitted in 1 cassette.  CM 06/13/2022  Final Diagnosis performed by Elijah Birk, MD.   Electronically signed 06/16/2022 10:24:28AM The electronic signature indicates that the named Attending Pathologist has evaluated the specimen Technical component performed at [facility], [address] [place], [place] [address] Lab: [phone] Dir: Jolene Schimke, MD, MMM  Professional component performed at [facility], [address] [place], [place], [place] [address] Lab: [phone] Dir: Beryle Quant, MD    Last lipids Lab Results   Component Value Date   CHOL 166 10/10/2021   HDL 46 (L) 10/10/2021   LDLCALC 104 (H) 10/10/2021   TRIG 75 10/10/2021   CHOLHDL 3.6 10/10/2021        Assessment & Plan:   Problem List Items Addressed This Visit       Other   Depression, major, recurrent, moderate (HCC) - Primary   Other insomnia   Obese   Anxiety   Hyperlipidemia   Relevant Orders   Lipid panel   Vasomotor symptoms due to menopause   Other Visit Diagnoses       Screening for deficiency anemia       Relevant Orders   CBC with Differential/Platelet     Screening for diabetes mellitus       Relevant Orders   COMPLETE METABOLIC PANEL WITH GFR   Hemoglobin  A1c     Flu vaccine need       Relevant Orders   Flu vaccine trivalent PF, 6mos and older(Flulaval,Afluria,Fluarix,Fluzone) (Completed)        Assessment and Plan    Menopausal Vasomotor Symptoms Improved with Activella.  -Continue Activella daily.  Depression/Anxiety Negative scores, improved mood with Prozac. -Continue Prozac 10mg  daily.  Insomnia Improved with Activella and Trazodone. -Continue Trazodone 50mg  at bedtime.  Obesity Weight stable, BMI 33.07. Patient has initiated personal training twice weekly for weight management. -Continue Metformin 500mg  twice daily. -Encourage continuation of personal training and healthy eating habits.  Hyperlipidemia getting labs today -Continue current management.  Asthma No current issues reported. -Continue current management.  General Health Maintenance -Order blood work. -Administer influenza vaccine today. -Ensure patient has sufficient refills for all current medications.        Follow up plan: Return in about 6 months (around 08/03/2023) for follow up.

## 2023-02-03 LAB — CBC WITH DIFFERENTIAL/PLATELET
Absolute Lymphocytes: 1506 {cells}/uL (ref 850–3900)
Absolute Monocytes: 363 {cells}/uL (ref 200–950)
Basophils Absolute: 41 {cells}/uL (ref 0–200)
Basophils Relative: 1.1 %
Eosinophils Absolute: 141 {cells}/uL (ref 15–500)
Eosinophils Relative: 3.8 %
HCT: 38.5 % (ref 35.0–45.0)
Hemoglobin: 12.7 g/dL (ref 11.7–15.5)
MCH: 29.9 pg (ref 27.0–33.0)
MCHC: 33 g/dL (ref 32.0–36.0)
MCV: 90.6 fL (ref 80.0–100.0)
MPV: 11.7 fL (ref 7.5–12.5)
Monocytes Relative: 9.8 %
Neutro Abs: 1650 {cells}/uL (ref 1500–7800)
Neutrophils Relative %: 44.6 %
Platelets: 274 10*3/uL (ref 140–400)
RBC: 4.25 10*6/uL (ref 3.80–5.10)
RDW: 12.5 % (ref 11.0–15.0)
Total Lymphocyte: 40.7 %
WBC: 3.7 10*3/uL — ABNORMAL LOW (ref 3.8–10.8)

## 2023-02-03 LAB — LIPID PANEL
Cholesterol: 176 mg/dL (ref <200)
HDL: 49 mg/dL — ABNORMAL LOW (ref >=50)
LDL Cholesterol (Calc): 113 mg/dL — ABNORMAL HIGH
Non-HDL Cholesterol (Calc): 127 mg/dL (ref <130)
Total CHOL/HDL Ratio: 3.6 (calc) (ref <5.0)
Triglycerides: 48 mg/dL (ref <150)

## 2023-02-03 LAB — COMPLETE METABOLIC PANEL WITH GFR
AG Ratio: 1.5 (calc) (ref 1.0–2.5)
ALT: 10 U/L (ref 6–29)
AST: 12 U/L (ref 10–35)
Albumin: 4.1 g/dL (ref 3.6–5.1)
Alkaline phosphatase (APISO): 56 U/L (ref 37–153)
BUN: 9 mg/dL (ref 7–25)
CO2: 25 mmol/L (ref 20–32)
Calcium: 9.1 mg/dL (ref 8.6–10.4)
Chloride: 105 mmol/L (ref 98–110)
Creat: 0.69 mg/dL (ref 0.50–1.03)
Globulin: 2.7 g/dL (ref 1.9–3.7)
Glucose, Bld: 84 mg/dL (ref 65–99)
Potassium: 4.2 mmol/L (ref 3.5–5.3)
Sodium: 138 mmol/L (ref 135–146)
Total Bilirubin: 0.4 mg/dL (ref 0.2–1.2)
Total Protein: 6.8 g/dL (ref 6.1–8.1)
eGFR: 105 mL/min/{1.73_m2} (ref >=60)

## 2023-02-03 LAB — HEMOGLOBIN A1C
Hgb A1c MFr Bld: 5.6 %{Hb} (ref <5.7)
Mean Plasma Glucose: 114 mg/dL
eAG (mmol/L): 6.3 mmol/L

## 2023-03-23 DIAGNOSIS — L2084 Intrinsic (allergic) eczema: Secondary | ICD-10-CM | POA: Diagnosis not present

## 2023-03-23 DIAGNOSIS — L810 Postinflammatory hyperpigmentation: Secondary | ICD-10-CM | POA: Diagnosis not present

## 2023-05-06 ENCOUNTER — Ambulatory Visit: Payer: Self-pay | Admitting: Nurse Practitioner

## 2023-05-11 ENCOUNTER — Encounter: Payer: Self-pay | Admitting: Nurse Practitioner

## 2023-05-13 ENCOUNTER — Ambulatory Visit: Admitting: Nurse Practitioner

## 2023-05-21 ENCOUNTER — Ambulatory Visit: Admitting: Nurse Practitioner

## 2023-05-27 NOTE — Progress Notes (Unsigned)
 There were no vitals taken for this visit.   Subjective:    Patient ID: Morgan Kemp, female    DOB: [birth year], 53 y.o.   MRN: [id number]  HPI: Morgan Kemp is a 53 y.o. female  No chief complaint on file.   Discussed the use of AI scribe software for clinical note transcription with the patient, who gave verbal consent to proceed.  History of Present Illness          02/02/2023    7:59 AM 11/03/2022    8:14 AM 10/06/2022    8:37 AM  Depression screen PHQ 2/9  Decreased Interest 0 0 1  Down, Depressed, Hopeless 0 0 1  PHQ - 2 Score 0 0 2  Altered sleeping 0 0 1  Tired, decreased energy 0 0 2  Change in appetite 0 1 2  Feeling bad or failure about yourself  0 0 2  Trouble concentrating 1 1 2   Moving slowly or fidgety/restless 1 0 2  Suicidal thoughts 0 0 0  PHQ-9 Score 2 2 13   Difficult doing work/chores Somewhat difficult Not difficult at all Somewhat difficult    Relevant past medical, surgical, family and social history reviewed and updated as indicated. Interim medical history since our last visit reviewed. Allergies and medications reviewed and updated.  Review of Systems  Per HPI unless specifically indicated above     Objective:    There were no vitals taken for this visit.  {Vitals History (Optional):23777} Wt Readings from Last 3 Encounters:  02/02/23 180 lb 12.8 oz (82 kg)  11/03/22 176 lb 4.8 oz (80 kg)  10/06/22 174 lb 1.6 oz (79 kg)    Physical Exam Physical Exam    Results for orders placed or performed in visit on 02/02/23  CBC with Differential/Platelet   Collection Time: 02/02/23  8:21 AM  Result Value Ref Range   WBC 3.7 (L) 3.8 - 10.8 Thousand/uL   RBC 4.25 3.80 - 5.10 Million/uL   Hemoglobin 12.7 11.7 - 15.5 g/dL   HCT 78.2 95.6 - 21.3 %   MCV 90.6 80.0 - 100.0 fL   MCH 29.9 27.0 - 33.0 pg   MCHC 33.0 32.0 - 36.0 g/dL   RDW 08.6 57.8 - 46.9 %   Platelets 274 140 - 400 Thousand/uL   MPV 11.7 7.5 - 12.5 fL    Neutro Abs 1,650 1,500 - 7,800 cells/uL   Absolute Lymphocytes 1,506 850 - 3,900 cells/uL   Absolute Monocytes 363 200 - 950 cells/uL   Eosinophils Absolute 141 15 - 500 cells/uL   Basophils Absolute 41 0 - 200 cells/uL   Neutrophils Relative % 44.6 %   Total Lymphocyte 40.7 %   Monocytes Relative 9.8 %   Eosinophils Relative 3.8 %   Basophils Relative 1.1 %  COMPLETE METABOLIC PANEL WITH GFR   Collection Time: 02/02/23  8:21 AM  Result Value Ref Range   Glucose, Bld 84 65 - 99 mg/dL   BUN 9 7 - 25 mg/dL   Creat 6.29 5.28 - 4.13 mg/dL   eGFR 244 > OR = 60 WN/UUV/2.53G6   BUN/Creatinine Ratio SEE NOTE: 6 - 22 (calc)   Sodium 138 135 - 146 mmol/L   Potassium 4.2 3.5 - 5.3 mmol/L   Chloride 105 98 - 110 mmol/L   CO2 25 20 - 32 mmol/L   Calcium 9.1 8.6 - 10.4 mg/dL   Total Protein 6.8 6.1 - 8.1 g/dL   Albumin 4.1 3.6 -  5.1 g/dL   Globulin 2.7 1.9 - 3.7 g/dL (calc)   AG Ratio 1.5 1.0 - 2.5 (calc)   Total Bilirubin 0.4 0.2 - 1.2 mg/dL   Alkaline phosphatase (APISO) 56 37 - 153 U/L   AST 12 10 - 35 U/L   ALT 10 6 - 29 U/L  Lipid panel   Collection Time: 02/02/23  8:21 AM  Result Value Ref Range   Cholesterol 176 <200 mg/dL   HDL 49 (L) > OR = 50 mg/dL   Triglycerides 48 <409 mg/dL   LDL Cholesterol (Calc) 113 (H) mg/dL (calc)   Total CHOL/HDL Ratio 3.6 <5.0 (calc)   Non-HDL Cholesterol (Calc) 127 <130 mg/dL (calc)  Hemoglobin W1X   Collection Time: 02/02/23  8:21 AM  Result Value Ref Range   Hgb A1c MFr Bld 5.6 <5.7 % of total Hgb   Mean Plasma Glucose 114 mg/dL   eAG (mmol/L) 6.3 mmol/L   {Labs (Optional):23779}    Assessment & Plan:   Problem List Items Addressed This Visit   None    Assessment and Plan Assessment & Plan         Follow up plan: No follow-ups on file.

## 2023-05-28 ENCOUNTER — Ambulatory Visit: Admitting: Nurse Practitioner

## 2023-05-28 VITALS — BP 118/64 | HR 89 | Resp 18 | Ht 62.0 in | Wt 180.7 lb

## 2023-05-28 DIAGNOSIS — N951 Menopausal and female climacteric states: Secondary | ICD-10-CM

## 2023-05-28 DIAGNOSIS — J45990 Exercise induced bronchospasm: Secondary | ICD-10-CM

## 2023-05-28 DIAGNOSIS — F331 Major depressive disorder, recurrent, moderate: Secondary | ICD-10-CM | POA: Diagnosis not present

## 2023-05-28 DIAGNOSIS — E785 Hyperlipidemia, unspecified: Secondary | ICD-10-CM

## 2023-05-28 DIAGNOSIS — J301 Allergic rhinitis due to pollen: Secondary | ICD-10-CM

## 2023-05-28 DIAGNOSIS — E6609 Other obesity due to excess calories: Secondary | ICD-10-CM

## 2023-05-28 DIAGNOSIS — G4709 Other insomnia: Secondary | ICD-10-CM

## 2023-05-28 DIAGNOSIS — F419 Anxiety disorder, unspecified: Secondary | ICD-10-CM | POA: Diagnosis not present

## 2023-05-28 DIAGNOSIS — E66812 Obesity, class 2: Secondary | ICD-10-CM

## 2023-05-28 DIAGNOSIS — Z6836 Body mass index (BMI) 36.0-36.9, adult: Secondary | ICD-10-CM

## 2023-05-28 DIAGNOSIS — H9313 Tinnitus, bilateral: Secondary | ICD-10-CM | POA: Insufficient documentation

## 2023-05-28 DIAGNOSIS — L309 Dermatitis, unspecified: Secondary | ICD-10-CM

## 2023-05-28 DIAGNOSIS — M5136 Other intervertebral disc degeneration, lumbar region with discogenic back pain only: Secondary | ICD-10-CM

## 2023-05-28 MED ORDER — FLUOXETINE HCL 10 MG PO TABS: 10.0000 mg | ORAL_TABLET | Freq: Every day | ORAL | 1 refills | Status: AC

## 2023-05-28 MED ORDER — TRAZODONE HCL 50 MG PO TABS: 50.0000 mg | ORAL_TABLET | Freq: Every day | ORAL | 1 refills | Status: AC

## 2023-05-28 MED ORDER — PHENTERMINE HCL 37.5 MG PO TABS
37.5000 mg | ORAL_TABLET | Freq: Every day | ORAL | 2 refills | Status: AC
Start: 2023-05-28 — End: ?

## 2023-06-02 ENCOUNTER — Other Ambulatory Visit: Payer: Self-pay | Admitting: Nurse Practitioner

## 2023-06-02 DIAGNOSIS — F419 Anxiety disorder, unspecified: Secondary | ICD-10-CM

## 2023-06-02 DIAGNOSIS — F331 Major depressive disorder, recurrent, moderate: Secondary | ICD-10-CM

## 2023-06-03 NOTE — Telephone Encounter (Signed)
 Requested medication (s) are due for refill today: yes  Requested medication (s) are on the active medication list: yes  Last refill:  05/28/23  Future visit scheduled: no  Notes to clinic:  Pharmacy comment: Alternative Requested:NOT COVERED.      Requested Prescriptions  Pending Prescriptions Disp Refills   Fluoxetine HCl, PMDD, 10 MG TABS [Pharmacy Med Name: FLUOXETINE HCL 10 MG TABLET] 90 tablet 1    Sig: TAKE 1 TABLET BY MOUTH EVERY DAY     Psychiatry:  Antidepressants - SSRI Passed - 06/03/2023  1:53 PM      Passed - Completed PHQ-2 or PHQ-9 in the last 360 days      Passed - Valid encounter within last 6 months    Recent Outpatient Visits           6 days ago ASTHMA, EXERCISE INDUCED   [facility] [place], [place]

## 2023-06-27 ENCOUNTER — Other Ambulatory Visit: Payer: Self-pay | Admitting: Nurse Practitioner

## 2023-06-27 DIAGNOSIS — E6609 Other obesity due to excess calories: Secondary | ICD-10-CM

## 2023-06-27 DIAGNOSIS — E66811 Obesity, class 1: Secondary | ICD-10-CM

## 2023-06-29 NOTE — Telephone Encounter (Signed)
 Requested Prescriptions  Refused Prescriptions Disp Refills   metFORMIN  (GLUCOPHAGE ) 500 MG tablet [Pharmacy Med Name: METFORMIN  HCL 500 MG TABLET] 180 tablet 0    Sig: TAKE 1 TABLET BY MOUTH TWICE A DAY WITH FOOD     Endocrinology:  Diabetes - Biguanides Failed - 06/29/2023  5:32 PM      Failed - B12 Level in normal range and within 720 days    No results found for: "VITAMINB12"       Passed - Cr in normal range and within 360 days    Creat  Date Value Ref Range Status  02/02/2023 0.69 0.50 - 1.03 mg/dL Final         Passed - HBA1C is between 0 and 7.9 and within 180 days    Hgb A1c MFr Bld  Date Value Ref Range Status  02/02/2023 5.6 <5.7 % of total Hgb Final    Comment:    For the purpose of screening for the presence of diabetes: . <5.7%       Consistent with the absence of diabetes 5.7-6.4%    Consistent with increased risk for diabetes             (prediabetes) > or =6.5%  Consistent with diabetes . This assay result is consistent with a decreased risk of diabetes. . Currently, no consensus exists regarding use of hemoglobin A1c for diagnosis of diabetes in children. . According to American Diabetes Association (ADA) guidelines, hemoglobin A1c <7.0% represents optimal control in non-pregnant diabetic patients. Different metrics may apply to specific patient populations.  Standards of Medical Care in Diabetes(ADA). .          Passed - eGFR in normal range and within 360 days    GFR  Date Value Ref Range Status  06/18/2015 111.52 >60.00 mL/min Final   eGFR  Date Value Ref Range Status  02/02/2023 105 > OR = 60 mL/min/1.53m2 Final         Passed - Valid encounter within last 6 months    Recent Outpatient Visits           1 month ago ASTHMA, EXERCISE INDUCED   [facility] [place], Monalisa Angles, FNP              Passed - CBC within normal limits and completed in the last 12 months    WBC  Date Value Ref Range Status  02/02/2023  3.7 (L) 3.8 - 10.8 Thousand/uL Final   RBC  Date Value Ref Range Status  02/02/2023 4.25 3.80 - 5.10 Million/uL Final   Hemoglobin  Date Value Ref Range Status  02/02/2023 12.7 11.7 - 15.5 g/dL Final  81/19/1478 29.5 11.1 - 15.9 g/dL Final   HCT  Date Value Ref Range Status  02/02/2023 38.5 35.0 - 45.0 % Final   Hematocrit  Date Value Ref Range Status  10/09/2020 37.0 34.0 - 46.6 % Final   MCHC  Date Value Ref Range Status  02/02/2023 33.0 32.0 - 36.0 g/dL Final    Comment:    For adults, a slight decrease in the calculated MCHC value (in the range of 30 to 32 g/dL) is most likely not clinically significant; however, it should be interpreted with caution in correlation with other red cell parameters and the patient's clinical condition.    [facility]  Date Value Ref Range Status  02/02/2023 29.9 27.0 - 33.0 pg Final   MCV  Date Value Ref Range Status  02/02/2023 90.6 80.0 -  100.0 fL Final  10/09/2020 83 79 - 97 fL Final   No results found for: "PLTCOUNTKUC", "LABPLAT", "POCPLA" RDW  Date Value Ref Range Status  02/02/2023 12.5 11.0 - 15.0 % Final  10/09/2020 14.6 11.7 - 15.4 % Final

## 2023-08-20 ENCOUNTER — Ambulatory Visit: Admitting: Nurse Practitioner

## 2023-08-27 ENCOUNTER — Ambulatory Visit: Admitting: Nurse Practitioner

## 2023-08-27 ENCOUNTER — Encounter: Payer: Self-pay | Admitting: Nurse Practitioner

## 2023-08-27 VITALS — BP 116/60 | HR 98 | Temp 98.3°F | Resp 16 | Ht 62.0 in | Wt 182.4 lb

## 2023-08-27 DIAGNOSIS — E785 Hyperlipidemia, unspecified: Secondary | ICD-10-CM

## 2023-08-27 DIAGNOSIS — Z131 Encounter for screening for diabetes mellitus: Secondary | ICD-10-CM | POA: Diagnosis not present

## 2023-08-27 DIAGNOSIS — Z6833 Body mass index (BMI) 33.0-33.9, adult: Secondary | ICD-10-CM

## 2023-08-27 DIAGNOSIS — E66811 Obesity, class 1: Secondary | ICD-10-CM

## 2023-08-27 DIAGNOSIS — M25561 Pain in right knee: Secondary | ICD-10-CM

## 2023-08-27 DIAGNOSIS — M5136 Other intervertebral disc degeneration, lumbar region with discogenic back pain only: Secondary | ICD-10-CM | POA: Diagnosis not present

## 2023-08-27 DIAGNOSIS — Z6836 Body mass index (BMI) 36.0-36.9, adult: Secondary | ICD-10-CM | POA: Diagnosis not present

## 2023-08-27 DIAGNOSIS — N951 Menopausal and female climacteric states: Secondary | ICD-10-CM

## 2023-08-27 DIAGNOSIS — L309 Dermatitis, unspecified: Secondary | ICD-10-CM

## 2023-08-27 DIAGNOSIS — F331 Major depressive disorder, recurrent, moderate: Secondary | ICD-10-CM | POA: Diagnosis not present

## 2023-08-27 DIAGNOSIS — J45990 Exercise induced bronchospasm: Secondary | ICD-10-CM

## 2023-08-27 DIAGNOSIS — F419 Anxiety disorder, unspecified: Secondary | ICD-10-CM

## 2023-08-27 DIAGNOSIS — E66812 Obesity, class 2: Secondary | ICD-10-CM

## 2023-08-27 DIAGNOSIS — J301 Allergic rhinitis due to pollen: Secondary | ICD-10-CM

## 2023-08-27 DIAGNOSIS — G4709 Other insomnia: Secondary | ICD-10-CM

## 2023-08-27 MED ORDER — NALTREXONE-BUPROPION HCL ER 8-90 MG PO TB12
ORAL_TABLET | ORAL | 5 refills | Status: AC
Start: 2023-08-27 — End: ?

## 2023-08-27 MED ORDER — FLUOXETINE HCL 10 MG PO CAPS
10.0000 mg | ORAL_CAPSULE | Freq: Every day | ORAL | 3 refills | Status: AC
Start: 2023-08-27 — End: ?

## 2023-08-27 NOTE — Assessment & Plan Note (Signed)
 Continue with diet and exercise. Start taking Contrave .

## 2023-08-27 NOTE — Assessment & Plan Note (Signed)
 Continue taking Trazadone 50mg  at bedtime as needed.

## 2023-08-27 NOTE — Assessment & Plan Note (Signed)
 Continue taking Activella daily.

## 2023-08-27 NOTE — Assessment & Plan Note (Signed)
 Continue taking prozac  10mg . Refills sent in.

## 2023-08-27 NOTE — Assessment & Plan Note (Signed)
 Continue taking Prozac  10mg  daily.

## 2023-08-27 NOTE — Assessment & Plan Note (Signed)
 Take Zyrtec 10mg  daily and add in Pepcid  at night. Work on avoiding triggers.

## 2023-08-27 NOTE — Assessment & Plan Note (Signed)
 Continue with lifestyle modifications and limiting saturated fats.

## 2023-08-27 NOTE — Assessment & Plan Note (Signed)
 Continue with Dupixent. Follow-up with Dermatology.

## 2023-08-27 NOTE — Progress Notes (Signed)
 BP 116/60 (BP Location: Left Arm, Patient Position: Sitting, Cuff Size: Normal)   Pulse 98   Temp 98.3 F (36.8 C) (Oral)   Resp 16   Ht 5' 2 (1.575 m)   Wt 182 lb 6.4 oz (82.7 kg)   BMI 33.36 kg/m    Subjective:    Patient ID: Morgan Kemp, female    DOB: [birth year], 53 y.o.   MRN: [id number]  HPI: Morgan Kemp is a 53 y.o. female presenting today for a 3 month follow-up for chronic medical management and specifically weight/effects after starting back on the Phentermine .     Menopausal Symptoms: -Still taking Activella one tablet daily for vasomotor sx d/t menopause, reports benefit in sx. Denies needing refills. -lmp: 08/10/23  Obesity: -Diet: Reports well balanced diet- mostly plant based. Trying to limit sugars.  -Exercise: Strength training with trainer twice a week and also walking 2-3 times per week  -Taking phentermine  37.5 mg tablet and reports no change in weight. Denies any side effects. Interested in trying contrave . Information provided. Will send in Rx. -Weight today: 182 lb -Waist: 36 in   Hyperlipidemia: -Diet: Reports well balanced diet- mostly plant based. Trying to limit sugars and saturated fats. -Exercise: Strength training with trainer twice a week   Anxiety and depression: -Taking Prozac  10 mg daily, no side effects, needs refill. Will send in refill.  -Taking trazadone 50 mg as needed for sleep, reports benefit for sleep. No refills needed.   Allergic rhinitis/ eczema/ asthma: -Taking zyrtec 10 mg occasionally for allergic rhinitis sx but reports sx not well controlled.  -Talked about taking the zyrtec daily and adding in pepcid  at night for more control of allergy sx.  -Taking Dupixent 300 mg for eczema but reports wont be able to get rx anymore d/t insurance. Reports going to see Dermatologist this month to talk about other medication options.   Degenerative Disc disease: -Reports doing weight bearing exercise with trainer, which is  helping back pain.  Right knee pain: -Reports feeling something pop in R knee when running with daughter last week. Advised [facility] consult. [facility] and [facility] next [facility] and is hopeful they can set her up with Orthopedics. Pain reports discomfort in R knee but is able to walk and complete ADL's.             05/28/2023    8:44 AM 02/02/2023    7:59 AM 11/03/2022    8:14 AM  Depression screen PHQ 2/9  Decreased Interest 0 0 0  Down, Depressed, Hopeless 0 0 0  PHQ - 2 Score 0 0 0  Altered sleeping 1 0 0  Tired, decreased energy 0 0 0  Change in appetite 1 0 1  Feeling bad or failure about yourself  0 0 0  Trouble concentrating 1 1 1   Moving slowly or fidgety/restless 0 1 0  Suicidal thoughts 0 0 0  PHQ-9 Score 3 2 2   Difficult doing work/chores Not difficult at all Somewhat difficult Not difficult at all    Relevant past medical, surgical, family and social history reviewed and updated as indicated. Interim medical history since our last visit reviewed. Allergies and medications reviewed and updated.  Review of Systems Constitutional: Negative for fever or weight change.  Respiratory: Negative for cough and shortness of breath.   Cardiovascular: Negative for chest pain or palpitations.  Gastrointestinal: Negative for abdominal pain, no bowel  changes.  Musculoskeletal:Reports R knee discomfort.  Skin: Reports eczema to neck and chest  Neurological: Negative for dizziness or headache.  No other specific complaints in a complete review of systems (except as listed in HPI above).     Objective:     BP 116/60 (BP Location: Left Arm, Patient Position: Sitting, Cuff Size: Normal)   Pulse 98   Temp 98.3 F (36.8 C) (Oral)   Resp 16   Ht 5' 2 (1.575 m)   Wt 182 lb 6.4 oz (82.7 kg)   BMI 33.36 kg/m    Wt Readings from Last 3 Encounters:  08/27/23 182 lb 6.4 oz (82.7 kg)  05/28/23 180 lb 11.2 oz (82 kg)  02/02/23 180  lb 12.8 oz (82 kg)    Physical Exam Constitutional:      Appearance: Normal appearance.  HENT:     Head: Normocephalic and atraumatic.  Cardiovascular:     Rate and Rhythm: Normal rate and regular rhythm.  Pulmonary:     Effort: Pulmonary effort is normal.     Breath sounds: Normal breath sounds.  Musculoskeletal:     Right lower leg: Tenderness present. No swelling or deformity.  Skin:    General: Skin is warm and dry.  Neurological:     General: No focal deficit present.     Mental Status: She is alert and oriented to person, place, and time.  Psychiatric:        Mood and Affect: Mood normal.        Behavior: Behavior normal.        Thought Content: Thought content normal.        Judgment: Judgment normal.     Waist Measurement : 36 inches (inches)   Results for orders placed or performed in visit on 02/02/23  CBC with Differential/Platelet   Collection Time: 02/02/23  8:21 AM  Result Value Ref Range   WBC 3.7 (L) 3.8 - 10.8 Thousand/uL   RBC 4.25 3.80 - 5.10 Million/uL   Hemoglobin 12.7 11.7 - 15.5 g/dL   HCT 61.4 64.9 - 54.9 %   MCV 90.6 80.0 - 100.0 fL   MCH 29.9 27.0 - 33.0 pg   MCHC 33.0 32.0 - 36.0 g/dL   RDW 87.4 88.9 - 84.9 %   Platelets 274 140 - 400 Thousand/uL   MPV 11.7 7.5 - 12.5 fL   Neutro Abs 1,650 1,500 - 7,800 cells/uL   Absolute Lymphocytes 1,506 850 - 3,900 cells/uL   Absolute Monocytes 363 200 - 950 cells/uL   Eosinophils Absolute 141 15 - 500 cells/uL   Basophils Absolute 41 0 - 200 cells/uL   Neutrophils Relative % 44.6 %   Total Lymphocyte 40.7 %   Monocytes Relative 9.8 %   Eosinophils Relative 3.8 %   Basophils Relative 1.1 %  COMPLETE METABOLIC PANEL WITH GFR   Collection Time: 02/02/23  8:21 AM  Result Value Ref Range   Glucose, Bld 84 65 - 99 mg/dL   BUN 9 7 - 25 mg/dL   Creat 9.30 9.49 - 8.96 mg/dL   eGFR 894 > OR = 60 fO/fpw/8.26f7   BUN/Creatinine Ratio SEE NOTE: 6 - 22 (calc)   Sodium 138 135 - 146 mmol/L   Potassium 4.2  3.5 - 5.3 mmol/L   Chloride 105 98 - 110 mmol/L   CO2 25 20 - 32 mmol/L   Calcium 9.1 8.6 - 10.4 mg/dL   Total Protein 6.8 6.1 - 8.1 g/dL   Albumin 4.1  3.6 - 5.1 g/dL   Globulin 2.7 1.9 - 3.7 g/dL (calc)   AG Ratio 1.5 1.0 - 2.5 (calc)   Total Bilirubin 0.4 0.2 - 1.2 mg/dL   Alkaline phosphatase (APISO) 56 37 - 153 U/L   AST 12 10 - 35 U/L   ALT 10 6 - 29 U/L  Lipid panel   Collection Time: 02/02/23  8:21 AM  Result Value Ref Range   Cholesterol 176 <200 mg/dL   HDL 49 (L) > OR = 50 mg/dL   Triglycerides 48 <849 mg/dL   LDL Cholesterol (Calc) 113 (H) mg/dL (calc)   Total CHOL/HDL Ratio 3.6 <5.0 (calc)   Non-HDL Cholesterol (Calc) 127 <130 mg/dL (calc)  Hemoglobin J8r   Collection Time: 02/02/23  8:21 AM  Result Value Ref Range   Hgb A1c MFr Bld 5.6 <5.7 % of total Hgb   Mean Plasma Glucose 114 mg/dL   eAG (mmol/L) 6.3 mmol/L          Assessment & Plan:   Problem List Items Addressed This Visit       Respiratory   Allergic rhinitis due to pollen   Take Zyrtec 10mg  daily and add in Pepcid  at night. Work on avoiding triggers.       ASTHMA, EXERCISE INDUCED     Musculoskeletal and Integument   Eczema   Continue with Dupixent. Follow-up with Dermatology.       Degeneration of intervertebral disc of lumbar region with discogenic back pain   Continue with weight bearing exercised with trainer        Other   Depression, major, recurrent, moderate (HCC)   Continue taking Prozac  10mg  daily.       Relevant Medications   FLUoxetine  (PROZAC ) 10 MG capsule   Other insomnia   Continue taking Trazadone 50mg  at bedtime as needed.       Obese   Continue with diet and exercise. Start taking Contrave .       Relevant Medications   Naltrexone -buPROPion  HCl ER 8-90 MG TB12   Other Relevant Orders   CBC with Differential/Platelet   Comprehensive metabolic panel with GFR   Anxiety   Continue taking prozac  10mg . Refills sent in.       Relevant Medications    FLUoxetine  (PROZAC ) 10 MG capsule   Hyperlipidemia - Primary   Continue with lifestyle modifications and limiting saturated fats.       Relevant Orders   Lipid panel   Lipoprotein A (LPA)   Vasomotor symptoms due to menopause   Continue taking Activella daily.       Relevant Orders   CBC with Differential/Platelet   Comprehensive metabolic panel with GFR   Other Visit Diagnoses       Screening for diabetes mellitus       Chrcking A1C today. Continue with lifestyle modifications   Relevant Orders   Hemoglobin A1c     Acute pain of right knee       [facility]         General Health Maintenance:  Routine labwork done today.     Follow up plan: Return in about 3 months (around 11/27/2023).    I have reviewed this encounter including the documentation in this note and/or discussed this patient with the provider, Aislinn Womack, SNP, I am certifying that I agree with the content of this note as supervising/preceptor nurse practitioner.  Mliss Spray, FNP-C [facility] 08/27/2023, 9:39 AM

## 2023-08-27 NOTE — Assessment & Plan Note (Signed)
 Continue with weight bearing exercised with trainer

## 2023-08-28 ENCOUNTER — Ambulatory Visit: Payer: Self-pay | Admitting: Nurse Practitioner

## 2023-08-30 LAB — LIPID PANEL
Cholesterol: 185 mg/dL (ref <200)
HDL: 52 mg/dL (ref >=50)
LDL Cholesterol (Calc): 119 mg/dL — ABNORMAL HIGH
Non-HDL Cholesterol (Calc): 133 mg/dL — ABNORMAL HIGH (ref <130)
Total CHOL/HDL Ratio: 3.6 (calc) (ref <5.0)
Triglycerides: 51 mg/dL (ref <150)

## 2023-08-30 LAB — COMPREHENSIVE METABOLIC PANEL WITH GFR
AG Ratio: 1.6 (calc) (ref 1.0–2.5)
ALT: 24 U/L (ref 6–29)
AST: 22 U/L (ref 10–35)
Albumin: 4.4 g/dL (ref 3.6–5.1)
Alkaline phosphatase (APISO): 61 U/L (ref 37–153)
BUN: 8 mg/dL (ref 7–25)
CO2: 26 mmol/L (ref 20–32)
Calcium: 9.1 mg/dL (ref 8.6–10.4)
Chloride: 105 mmol/L (ref 98–110)
Creat: 0.8 mg/dL (ref 0.50–1.03)
Globulin: 2.7 g/dL (ref 1.9–3.7)
Glucose, Bld: 79 mg/dL (ref 65–99)
Potassium: 4.4 mmol/L (ref 3.5–5.3)
Sodium: 140 mmol/L (ref 135–146)
Total Bilirubin: 0.4 mg/dL (ref 0.2–1.2)
Total Protein: 7.1 g/dL (ref 6.1–8.1)
eGFR: 89 mL/min/1.73m2 (ref >=60)

## 2023-08-30 LAB — CBC WITH DIFFERENTIAL/PLATELET
Absolute Lymphocytes: 2070 {cells}/uL (ref 850–3900)
Absolute Monocytes: 450 {cells}/uL (ref 200–950)
Basophils Absolute: 60 {cells}/uL (ref 0–200)
Basophils Relative: 1.2 %
Eosinophils Absolute: 260 {cells}/uL (ref 15–500)
Eosinophils Relative: 5.2 %
HCT: 42.8 % (ref 35.0–45.0)
Hemoglobin: 13.6 g/dL (ref 11.7–15.5)
MCH: 29.9 pg (ref 27.0–33.0)
MCHC: 31.8 g/dL — ABNORMAL LOW (ref 32.0–36.0)
MCV: 94.1 fL (ref 80.0–100.0)
MPV: 11.5 fL (ref 7.5–12.5)
Monocytes Relative: 9 %
Neutro Abs: 2160 {cells}/uL (ref 1500–7800)
Neutrophils Relative %: 43.2 %
Platelets: 273 Thousand/uL (ref 140–400)
RBC: 4.55 Million/uL (ref 3.80–5.10)
RDW: 12.8 % (ref 11.0–15.0)
Total Lymphocyte: 41.4 %
WBC: 5 Thousand/uL (ref 3.8–10.8)

## 2023-08-30 LAB — HEMOGLOBIN A1C
Hgb A1c MFr Bld: 5.5 % (ref <5.7)
Mean Plasma Glucose: 111 mg/dL
eAG (mmol/L): 6.2 mmol/L

## 2023-08-30 LAB — LIPOPROTEIN A (LPA): Lipoprotein (a): 197 nmol/L — ABNORMAL HIGH (ref <75)

## 2023-09-14 DIAGNOSIS — L2084 Intrinsic (allergic) eczema: Secondary | ICD-10-CM | POA: Diagnosis not present

## 2023-09-30 ENCOUNTER — Other Ambulatory Visit: Payer: Self-pay | Admitting: Internal Medicine

## 2023-09-30 DIAGNOSIS — Z1231 Encounter for screening mammogram for malignant neoplasm of breast: Secondary | ICD-10-CM

## 2023-11-23 DIAGNOSIS — Z79899 Other long term (current) drug therapy: Secondary | ICD-10-CM | POA: Diagnosis not present

## 2023-11-30 ENCOUNTER — Ambulatory Visit: Admitting: Nurse Practitioner

## 2023-12-18 ENCOUNTER — Other Ambulatory Visit: Payer: Self-pay | Admitting: Medical Genetics

## 2023-12-18 DIAGNOSIS — Z006 Encounter for examination for normal comparison and control in clinical research program: Secondary | ICD-10-CM

## 2024-02-02 ENCOUNTER — Encounter: Payer: Self-pay | Admitting: Internal Medicine

## 2024-02-02 ENCOUNTER — Ambulatory Visit
Admission: RE | Admit: 2024-02-02 | Discharge: 2024-02-02 | Disposition: A | Source: Ambulatory Visit | Attending: Internal Medicine | Admitting: Internal Medicine

## 2024-02-02 DIAGNOSIS — Z1231 Encounter for screening mammogram for malignant neoplasm of breast: Secondary | ICD-10-CM | POA: Insufficient documentation

## 2024-03-17 ENCOUNTER — Ambulatory Visit: Admitting: Orthopedic Surgery

## 2024-03-17 ENCOUNTER — Other Ambulatory Visit: Payer: Self-pay

## 2024-03-17 VITALS — BP 118/75 | HR 62 | Ht 64.0 in | Wt 170.0 lb

## 2024-03-17 DIAGNOSIS — M545 Low back pain, unspecified: Secondary | ICD-10-CM | POA: Diagnosis not present

## 2024-03-17 NOTE — Progress Notes (Signed)
 Orthopedic Spine Surgery Office Note  Assessment: Patient is a 54 y.o. female with chronic, progressive low back pain.  Also has pain radiate into bilateral buttocks and the right posterior thigh.  Has a spondylolisthesis at L4/5 with significant facet arthropathy and stenosis at that level   Plan: -Explained that initially conservative treatment is tried as a significant number of patients may experience relief with these treatment modalities. Discussed that the conservative treatments include:  -activity modification  -physical therapy  -over the counter pain medications  -medrol  dosepak  -lumbar steroid injections -Patient has tried Tylenol, ibuprofen -Since the majority of her pain is in the back and she does have significant facet arthropathy, recommended a diagnostic/therapeutic facet injection at L4/5.  If she does well with those injections, could consider RFA in the future -Patient should return to office in 8 weeks, x-rays at next visit: None   Patient expressed understanding of the plan and all questions were answered to the patient's satisfaction.   ___________________________________________________________________________   History:  Patient is a 54 y.o. female who presents today for lumbar spine.  Patient has had several years of low back pain.  She feels it in the lower lumbar region.  It has gotten progressively worse with time.  She has developed pain that radiates into her bilateral buttocks and into the right posterior thigh.  She notes the pain on a daily basis.  Her pain is worse in the back than it is in the leg.  She has tried over-the-counter medications but has not tried any other treatments for this pain.  Her pain is no longer controlled with over-the-counter medications.  There was no trauma or injury that preceded the onset of her pain.  Pain can be rated 10 out of 10 at its worst.  On a good day, her pain is a 4 out of 10.   Weakness: denies Symptoms of  imbalance: denies Paresthesias and numbness: denies Bowel or bladder incontinence: denies Saddle anesthesia: denies  Treatments tried: Tylenol, ibuprofen  Review of systems: Denies fevers and chills, night sweats, unexplained weight loss, history of cancer.  Has had pain that wakes her at night  Past medical history: Depression/anxiety Allergic rhinitis Chronic pain  Allergies: NKDA  Past surgical history:  Fibroid embolization  Social history: Denies use of nicotine product (smoking, vaping, patches, smokeless) Alcohol use: Denies Denies recreational drug use   Physical Exam:  BMI of 29.2  General: no acute distress, appears stated age Neurologic: alert, answering questions appropriately, following commands Respiratory: unlabored breathing on room air, symmetric chest rise Psychiatric: appropriate affect, normal cadence to speech   MSK (spine):  -Strength exam      Left  Right EHL    5/5  5/5 TA    5/5  5/5 GSC    5/5  5/5 Knee extension  5/5  5/5 Hip flexion   5/5  5/5  -Sensory exam    Sensation intact to light touch in L3-S1 nerve distributions of bilateral lower extremities  -Achilles DTR: 2/4 on the left, 2/4 on the right -Patellar tendon DTR: 2/4 on the left, 2/4 on the right  -Straight leg raise: Negative bilaterally -Clonus: no beats bilaterally  -Left hip exam: No pain through range of motion, negative Stinchfield, negative FABER -Right hip exam: No pain through range of motion, negative Stinchfield, negative FABER  Imaging: XRs of the lumbar spine from 03/17/2024 were independently reviewed and interpreted, showing disc height loss and spondylolisthesis at L4/5. No other significant degenerative  changes seen. No fracture or dislocation seen.   MRI of the lumbar spine (on CD) from 11/30/2023 was independently reviewed and interpreted, showing DDD and spondylolisthesis at L4/5. Facet hypertrophy at L4/5. Central and lateral recess stenosis at  L4/5. No other significant stenosis seen.    Patient name: Morgan Kemp Patient MRN: [id number] Date of visit: 03/17/24
